# Patient Record
Sex: Male | Born: 1975 | Race: White | Hispanic: No | Marital: Married | Smoking: Never smoker
Health system: Southern US, Community
[De-identification: ages and names within clinical notes are randomized; demographics above are authoritative.]

## PROBLEM LIST (undated history)

## (undated) DIAGNOSIS — I10 Essential (primary) hypertension: Secondary | ICD-10-CM

## (undated) HISTORY — PX: APPENDECTOMY: SHX54

## (undated) HISTORY — PX: BACK SURGERY: SHX140

## (undated) HISTORY — PX: NECK SURGERY: SHX720

---

## 2013-04-27 ENCOUNTER — Other Ambulatory Visit: Payer: Self-pay | Admitting: Orthopedic Surgery

## 2013-04-27 DIAGNOSIS — M545 Low back pain: Secondary | ICD-10-CM

## 2013-04-27 DIAGNOSIS — R202 Paresthesia of skin: Secondary | ICD-10-CM

## 2013-05-04 ENCOUNTER — Other Ambulatory Visit: Payer: Self-pay

## 2013-05-29 ENCOUNTER — Ambulatory Visit
Admission: RE | Admit: 2013-05-29 | Discharge: 2013-05-29 | Disposition: A | Discharge status: Home / Self Care | Payer: Managed Care, Other (non HMO) | Source: Ambulatory Visit | Attending: Orthopedic Surgery | Admitting: Orthopedic Surgery

## 2013-05-29 DIAGNOSIS — R202 Paresthesia of skin: Secondary | ICD-10-CM

## 2013-05-29 DIAGNOSIS — M545 Low back pain, unspecified: Secondary | ICD-10-CM

## 2014-06-21 ENCOUNTER — Emergency Department (HOSPITAL_COMMUNITY): Payer: Managed Care, Other (non HMO)

## 2014-06-21 ENCOUNTER — Encounter (HOSPITAL_COMMUNITY): Payer: Self-pay | Admitting: Emergency Medicine

## 2014-06-21 ENCOUNTER — Emergency Department (HOSPITAL_COMMUNITY)
Admission: EM | Admit: 2014-06-21 | Discharge: 2014-06-21 | Disposition: A | Discharge status: Home / Self Care | Payer: Managed Care, Other (non HMO) | Attending: Emergency Medicine | Admitting: Emergency Medicine

## 2014-06-21 DIAGNOSIS — M549 Dorsalgia, unspecified: Secondary | ICD-10-CM | POA: Insufficient documentation

## 2014-06-21 DIAGNOSIS — M5489 Other dorsalgia: Secondary | ICD-10-CM

## 2014-06-21 LAB — CBC WITH DIFFERENTIAL/PLATELET
Basophils Absolute: 0 10*3/uL (ref 0.0–0.1)
Basophils Relative: 0 % (ref 0–1)
EOS ABS: 0.1 10*3/uL (ref 0.0–0.7)
EOS PCT: 2 % (ref 0–5)
HEMATOCRIT: 41.5 % (ref 39.0–52.0)
Hemoglobin: 14.2 g/dL (ref 13.0–17.0)
LYMPHS ABS: 1.4 10*3/uL (ref 0.7–4.0)
Lymphocytes Relative: 20 % (ref 12–46)
MCH: 27.2 pg (ref 26.0–34.0)
MCHC: 34.2 g/dL (ref 30.0–36.0)
MCV: 79.5 fL (ref 78.0–100.0)
MONO ABS: 0.4 10*3/uL (ref 0.1–1.0)
MONOS PCT: 7 % (ref 3–12)
Neutro Abs: 4.9 10*3/uL (ref 1.7–7.7)
Neutrophils Relative %: 71 % (ref 43–77)
Platelets: 198 10*3/uL (ref 150–400)
RBC: 5.22 MIL/uL (ref 4.22–5.81)
RDW: 14.7 % (ref 11.5–15.5)
WBC: 6.8 10*3/uL (ref 4.0–10.5)

## 2014-06-21 LAB — BASIC METABOLIC PANEL
Anion gap: 14 (ref 5–15)
BUN: 14 mg/dL (ref 6–23)
CALCIUM: 9.6 mg/dL (ref 8.4–10.5)
CO2: 24 meq/L (ref 19–32)
CREATININE: 0.92 mg/dL (ref 0.50–1.35)
Chloride: 102 mEq/L (ref 96–112)
GFR calc Af Amer: >90 mL/min (ref >90)
GFR calc non Af Amer: >90 mL/min (ref >90)
GLUCOSE: 122 mg/dL — AB (ref 70–99)
Potassium: 4.2 mEq/L (ref 3.7–5.3)
Sodium: 140 mEq/L (ref 137–147)

## 2014-06-21 LAB — D-DIMER, QUANTITATIVE: D-Dimer, Quant: 1.24 ug/mL-FEU — ABNORMAL HIGH (ref 0.00–0.48)

## 2014-06-21 LAB — TROPONIN I

## 2014-06-21 MED ORDER — HYDROCODONE-ACETAMINOPHEN 5-325 MG PO TABS: 1.0000 | ORAL_TABLET | ORAL | Status: DC | PRN

## 2014-06-21 MED ORDER — IOHEXOL 350 MG/ML SOLN
100.0000 mL | Freq: Once | INTRAVENOUS | Status: AC | PRN
  Administered 2014-06-21: 100 mL via INTRAVENOUS

## 2014-06-21 MED ORDER — CYCLOBENZAPRINE HCL 10 MG PO TABS: 10.0000 mg | ORAL_TABLET | Freq: Two times a day (BID) | ORAL | Status: DC | PRN

## 2014-06-21 NOTE — ED Notes (Signed)
PT states that he began to have left shoulder blade pain that began suddenly and woke him from sleep; pt states that the pain radiates down his left arm; the pain is not worse with movement; [facility]; pt describes it as a nagging type pain

## 2014-06-21 NOTE — ED Notes (Signed)
Pt ambulating independently w/ steady gait on d/c in no acute distress, A&Ox4. D/c instructions reviewed w/ pt and family - pt and family deny any further questions or concerns at present. Rx given x2  

## 2014-06-21 NOTE — ED Provider Notes (Signed)
Medical screening examination/treatment/procedure(s) were performed by non-physician practitioner and as supervising physician I was immediately available for consultation/collaboration.   EKG Interpretation   Date/Time:  Friday June 21 2014 02:32:55 EDT Ventricular Rate:  104 PR Interval:  142 QRS Duration: 94 QT Interval:  346 QTC Calculation: 455 R Axis:   42 Text Interpretation:  Sinus tachycardia No old tracing to compare  Confirmed by Kennie Snedden  MD, Zanyah Lentsch ([id number]) on 06/21/2014 3:10:45 AM       Olivia Mackielga M Arleny Kruger, MD 06/21/14 [phone]

## 2014-06-21 NOTE — Discharge Instructions (Signed)
Back Pain, Adult Back pain is very common. The pain often gets better over time. The cause of back pain is usually not dangerous. Most people can learn to manage their back pain on their own.  HOME CARE   Stay active. Start with short walks on flat ground if you can. Try to walk farther each day.  Do not sit, drive, or stand in one place for more than 30 minutes. Do not stay in bed.  Do not avoid exercise or work. Activity can help your back heal faster.  Be careful when you bend or lift an object. Bend at your knees, keep the object close to you, and do not twist.  Sleep on a firm mattress. Lie on your side, and bend your knees. If you lie on your back, put a pillow under your knees.  Only take medicines as told by your doctor.  Put ice on the injured area.  Put ice in a plastic bag.  Place a towel between your skin and the bag.  Leave the ice on for 15-20 minutes, 03-04 times a day for the first 2 to 3 days. After that, you can switch between ice and heat packs.  Ask your doctor about back exercises or massage.  Avoid feeling anxious or stressed. Find good ways to deal with stress, such as exercise. GET HELP RIGHT AWAY IF:   Your pain does not go away with rest or medicine.  Your pain does not go away in 1 week.  You have new problems.  You do not feel well.  The pain spreads into your legs.  You cannot control when you poop (bowel movement) or pee (urinate).  Your arms or legs feel weak or lose feeling (numbness).  You feel sick to your stomach (nauseous) or throw up (vomit).  You have belly (abdominal) pain.  You feel like you may pass out (faint). MAKE SURE YOU:   Understand these instructions.  Will watch your condition.  Will get help right away if you are not doing well or get worse. Document Released: 05/17/2008 Document Revised: 02/21/2012 Document Reviewed: 04/19/2011 ExitCare Patient Information 2015 [employer]. This information is not intended  to replace advice given to you by your health care provider. Make sure you discuss any questions you have with your health care provider.  

## 2014-06-21 NOTE — ED Provider Notes (Signed)
CSN: [id number]     Arrival date & time 06/21/14  0223 History   First MD Initiated Contact with Patient 06/21/14 0324     Chief Complaint  Patient presents with  . Back Pain     (Consider location/radiation/quality/duration/timing/severity/associated sxs/prior Treatment) Patient is a 38 y.o. male presenting with back pain. The history is provided by the patient. No language interpreter was used.  Back Pain Location:  Thoracic spine Associated symptoms: no chest pain and no fever   Associated symptoms comment:  Sudden onset of pain in the posterior left shoulder and upper back that woke him from sleep. He denies SOB, chest pain. The discomfort is no worse with deep breathing or movement. He denies cough or fever. No nausea.    History reviewed. No pertinent past medical history. Past Surgical History  Procedure Laterality Date  . Neck surgery    . Back surgery     No family history on file. History  Substance Use Topics  . Smoking status: Never Smoker   . Smokeless tobacco: Not on file  . Alcohol Use: Yes     Comment: casual    Review of Systems  Constitutional: Negative for fever and chills.  HENT: Negative.   Respiratory: Negative.  Negative for cough and shortness of breath.   Cardiovascular: Negative.  Negative for chest pain.  Gastrointestinal: Negative.  Negative for nausea and vomiting.  Musculoskeletal: Positive for back pain.  Skin: Negative.   Neurological: Negative.       Allergies  Review of patient's allergies indicates no known allergies.  Home Medications   Prior to Admission medications   Medication Sig Start Date End Date Taking? Authorizing Provider  ibuprofen (ADVIL,MOTRIN) 200 MG tablet Take 400 mg by mouth every 6 (six) hours as needed for moderate pain.   Yes Historical Provider, MD  Multiple Vitamin (MULTIVITAMIN WITH MINERALS) TABS tablet Take 1 tablet by mouth daily.   Yes Historical Provider, MD   BP 165/105  Pulse 95  Temp(Src) 98.6  F (37 C) (Oral)  Resp 23  Ht 6' (1.829 m)  Wt 255 lb (115.667 kg)  BMI 34.58 kg/m2  SpO2 100% Physical Exam  Constitutional: He is oriented to person, place, and time. He appears well-developed and well-nourished.  HENT:  Head: Normocephalic.  Neck: Normal range of motion. Neck supple.  Cardiovascular: Normal rate and regular rhythm.   Pulmonary/Chest: Effort normal and breath sounds normal.  Abdominal: Soft. Bowel sounds are normal. There is no tenderness. There is no rebound and no guarding.  Musculoskeletal: Normal range of motion.  No reproducible tenderness of left upper back at site of complaint.  Neurological: He is alert and oriented to person, place, and time.  Skin: Skin is warm and dry. No rash noted.  Psychiatric: He has a normal mood and affect.    ED Course  Procedures (including critical care time) Labs Review Labs Reviewed  D-DIMER, QUANTITATIVE - Abnormal; Notable for the following:    D-Dimer, Quant 1.24 (*)    All other components within normal limits  CBC WITH DIFFERENTIAL  TROPONIN I  BASIC METABOLIC PANEL    Imaging Review Dg Chest 2 View  06/21/2014   CLINICAL DATA:  Upper left back pain near the scapula.  EXAM: CHEST  2 VIEW  COMPARISON:  None.  FINDINGS: Normal heart size and pulmonary vascularity. No focal airspace disease or consolidation in the lungs. No blunting of costophrenic angles. No pneumothorax. Postoperative changes in the cervical spine.  IMPRESSION:  No evidence of active pulmonary disease.   Electronically Signed   By: Burman NievesWilliam  Stevens M.D.   On: 06/21/2014 04:42     EKG Interpretation   Date/Time:  Friday June 21 2014 02:32:55 EDT Ventricular Rate:  104 PR Interval:  142 QRS Duration: 94 QT Interval:  346 QTC Calculation: 455 R Axis:   42 Text Interpretation:  Sinus tachycardia No old tracing to compare  Confirmed by OTTER  MD, OLGA ([id number]) on 06/21/2014 3:10:45 AM      MDM   Final diagnoses:  None    1. Back  pain  Elevated D-dimer in the setting of recent travel.  DDx:   angina - normal EKG, constant pain without SOB, nausea or CP. Troponin pending  PE - sole known risk factor is travel which he does frequently. No SOB, CP or pleuritic component, however, d-dimer positive. CT angio chest pending  Muscular back pain - not reproducible and no worse with movement.   Patient care transferred to Sharilyn SitesLisa Sanders pending labs and CT chest.      Arnoldo HookerShari A Milaya Hora, PA-C 06/21/14 [phone]

## 2014-06-21 NOTE — ED Notes (Signed)
Reports he awoke w/ left upper back pain "near his scapula" - pain awoke pt from his sleep. Pt denies shortness of breath, fever or chills.

## 2015-05-23 ENCOUNTER — Other Ambulatory Visit: Payer: Self-pay | Admitting: Orthopedic Surgery

## 2015-05-30 NOTE — Pre-Procedure Instructions (Signed)
    Gavin Khan  05/30/2015      [id number] [place], [place] - [address] [facility] [address] Phone: [phone] Fax: [phone]    Your procedure is scheduled on 06/04/15.  Report to [facility] Admitting at 630 A.M.  Call this number if you have problems the morning of surgery:  [phone]   Remember:  Do not eat food or drink liquids after midnight.  Take these medicines the morning of surgery with A SIP OF WATER flexeril,hydrocodone   Do not wear jewelry, make-up or nail polish.  Do not wear lotions, powders, or perfumes.  You may wear deodorant.  Do not shave 48 hours prior to surgery.  Men may shave face and neck.  Do not bring valuables to the hospital.  [facility] is not responsible for any belongings or valuables.  Contacts, dentures or bridgework may not be worn into surgery.  Leave your suitcase in the car.  After surgery it may be brought to your room.  For patients admitted to the hospital, discharge time will be determined by your treatment team.  Patients discharged the day of surgery will not be allowed to drive home.   Name and phone numbr of your driver:    Special instructions:   Please read over the following fact sheets that you were given. Pain Booklet, Coughing and Deep Breathing, Total Joint Packet and MRSA Information

## 2015-06-02 ENCOUNTER — Encounter (HOSPITAL_COMMUNITY): Payer: Self-pay

## 2015-06-02 ENCOUNTER — Encounter (HOSPITAL_COMMUNITY)
Admission: RE | Admit: 2015-06-02 | Discharge: 2015-06-02 | Disposition: A | Discharge status: Home / Self Care | Payer: Managed Care, Other (non HMO) | Source: Ambulatory Visit | Attending: Orthopedic Surgery | Admitting: Orthopedic Surgery

## 2015-06-02 DIAGNOSIS — M79602 Pain in left arm: Secondary | ICD-10-CM | POA: Diagnosis not present

## 2015-06-02 DIAGNOSIS — R200 Anesthesia of skin: Secondary | ICD-10-CM | POA: Diagnosis not present

## 2015-06-02 DIAGNOSIS — I10 Essential (primary) hypertension: Secondary | ICD-10-CM | POA: Diagnosis not present

## 2015-06-02 DIAGNOSIS — M79642 Pain in left hand: Secondary | ICD-10-CM | POA: Diagnosis not present

## 2015-06-02 DIAGNOSIS — M4803 Spinal stenosis, cervicothoracic region: Secondary | ICD-10-CM | POA: Diagnosis not present

## 2015-06-02 LAB — COMPREHENSIVE METABOLIC PANEL
ALT: 27 U/L (ref 17–63)
AST: 28 U/L (ref 15–41)
Albumin: 3.8 g/dL (ref 3.5–5.0)
Alkaline Phosphatase: 74 U/L (ref 38–126)
Anion gap: 6 (ref 5–15)
BUN: 15 mg/dL (ref 6–20)
CO2: 29 mmol/L (ref 22–32)
CREATININE: 0.95 mg/dL (ref 0.61–1.24)
Calcium: 9.8 mg/dL (ref 8.9–10.3)
Chloride: 105 mmol/L (ref 101–111)
GFR calc Af Amer: >60 mL/min (ref >60)
GFR calc non Af Amer: >60 mL/min (ref >60)
Glucose, Bld: 113 mg/dL — ABNORMAL HIGH (ref 65–99)
POTASSIUM: 3.9 mmol/L (ref 3.5–5.1)
Sodium: 140 mmol/L (ref 135–145)
Total Bilirubin: 0.6 mg/dL (ref 0.3–1.2)
Total Protein: 7.9 g/dL (ref 6.5–8.1)

## 2015-06-02 LAB — URINALYSIS, ROUTINE W REFLEX MICROSCOPIC
Bilirubin Urine: NEGATIVE
GLUCOSE, UA: NEGATIVE mg/dL
Hgb urine dipstick: NEGATIVE
KETONES UR: NEGATIVE mg/dL
LEUKOCYTES UA: NEGATIVE
Nitrite: NEGATIVE
PH: 5 (ref 5.0–8.0)
PROTEIN: NEGATIVE mg/dL
Specific Gravity, Urine: 1.015 (ref 1.005–1.030)
Urobilinogen, UA: 0.2 mg/dL (ref 0.0–1.0)

## 2015-06-02 LAB — CBC WITH DIFFERENTIAL/PLATELET
Basophils Absolute: 0 10*3/uL (ref 0.0–0.1)
Basophils Relative: 0 % (ref 0–1)
Eosinophils Absolute: 0.3 10*3/uL (ref 0.0–0.7)
Eosinophils Relative: 5 % (ref 0–5)
HCT: 40.3 % (ref 39.0–52.0)
Hemoglobin: 14.1 g/dL (ref 13.0–17.0)
LYMPHS PCT: 25 % (ref 12–46)
Lymphs Abs: 1.8 10*3/uL (ref 0.7–4.0)
MCH: 28.1 pg (ref 26.0–34.0)
MCHC: 35 g/dL (ref 30.0–36.0)
MCV: 80.4 fL (ref 78.0–100.0)
MONOS PCT: 7 % (ref 3–12)
Monocytes Absolute: 0.5 10*3/uL (ref 0.1–1.0)
NEUTROS ABS: 4.4 10*3/uL (ref 1.7–7.7)
NEUTROS PCT: 63 % (ref 43–77)
Platelets: 221 10*3/uL (ref 150–400)
RBC: 5.01 MIL/uL (ref 4.22–5.81)
RDW: 14.4 % (ref 11.5–15.5)
WBC: 7 10*3/uL (ref 4.0–10.5)

## 2015-06-02 LAB — APTT: aPTT: 29 seconds (ref 24–37)

## 2015-06-02 LAB — SURGICAL PCR SCREEN
MRSA, PCR: NEGATIVE
STAPHYLOCOCCUS AUREUS: NEGATIVE

## 2015-06-02 LAB — PROTIME-INR
INR: 1.01 (ref 0.00–1.49)
Prothrombin Time: 13.5 seconds (ref 11.6–15.2)

## 2015-06-03 MED ORDER — CEFAZOLIN SODIUM-DEXTROSE 2-3 GM-% IV SOLR
2.0000 g | INTRAVENOUS | Status: AC
  Administered 2015-06-04 (×2): 2 g via INTRAVENOUS
  Filled 2015-06-03: qty 50

## 2015-06-04 ENCOUNTER — Ambulatory Visit (HOSPITAL_COMMUNITY): Payer: Managed Care, Other (non HMO)

## 2015-06-04 ENCOUNTER — Encounter (HOSPITAL_COMMUNITY): Payer: Self-pay | Admitting: *Deleted

## 2015-06-04 ENCOUNTER — Observation Stay (HOSPITAL_COMMUNITY)
Admission: RE | Admit: 2015-06-04 | Discharge: 2015-06-05 | Disposition: A | Discharge status: Home / Self Care | Payer: Managed Care, Other (non HMO) | Source: Ambulatory Visit | Attending: Orthopedic Surgery | Admitting: Orthopedic Surgery

## 2015-06-04 ENCOUNTER — Ambulatory Visit (HOSPITAL_COMMUNITY): Payer: Managed Care, Other (non HMO) | Admitting: Anesthesiology

## 2015-06-04 ENCOUNTER — Encounter (HOSPITAL_COMMUNITY)
Admission: RE | Disposition: A | Discharge status: Home / Self Care | Payer: Managed Care, Other (non HMO) | Source: Ambulatory Visit | Attending: Orthopedic Surgery

## 2015-06-04 ENCOUNTER — Other Ambulatory Visit: Payer: Self-pay

## 2015-06-04 DIAGNOSIS — M79602 Pain in left arm: Secondary | ICD-10-CM | POA: Insufficient documentation

## 2015-06-04 DIAGNOSIS — M4803 Spinal stenosis, cervicothoracic region: Principal | ICD-10-CM | POA: Insufficient documentation

## 2015-06-04 DIAGNOSIS — M79642 Pain in left hand: Secondary | ICD-10-CM | POA: Insufficient documentation

## 2015-06-04 DIAGNOSIS — R200 Anesthesia of skin: Secondary | ICD-10-CM | POA: Insufficient documentation

## 2015-06-04 DIAGNOSIS — G549 Nerve root and plexus disorder, unspecified: Secondary | ICD-10-CM | POA: Diagnosis present

## 2015-06-04 DIAGNOSIS — I10 Essential (primary) hypertension: Secondary | ICD-10-CM | POA: Insufficient documentation

## 2015-06-04 DIAGNOSIS — Z419 Encounter for procedure for purposes other than remedying health state, unspecified: Secondary | ICD-10-CM

## 2015-06-04 HISTORY — PX: ANTERIOR CERVICAL DECOMP/DISCECTOMY FUSION: SHX1161

## 2015-06-04 SURGERY — ANTERIOR CERVICAL DECOMPRESSION/DISCECTOMY FUSION 3 LEVELS
Anesthesia: General | Site: Neck

## 2015-06-04 MED ORDER — FENTANYL CITRATE (PF) 250 MCG/5ML IJ SOLN
INTRAMUSCULAR | Status: AC
  Filled 2015-06-04: qty 5

## 2015-06-04 MED ORDER — SODIUM CHLORIDE 0.9 % IJ SOLN: 3.0000 mL | Freq: Two times a day (BID) | INTRAMUSCULAR | Status: DC

## 2015-06-04 MED ORDER — HYDROMORPHONE HCL 1 MG/ML IJ SOLN
INTRAMUSCULAR | Status: AC
  Filled 2015-06-04: qty 1

## 2015-06-04 MED ORDER — MIDAZOLAM HCL 2 MG/2ML IJ SOLN
INTRAMUSCULAR | Status: AC
  Filled 2015-06-04: qty 2

## 2015-06-04 MED ORDER — BISACODYL 5 MG PO TBEC
5.0000 mg | DELAYED_RELEASE_TABLET | Freq: Every day | ORAL | Status: DC | PRN
  Filled 2015-06-04: qty 1

## 2015-06-04 MED ORDER — OXYCODONE-ACETAMINOPHEN 5-325 MG PO TABS
ORAL_TABLET | ORAL | Status: AC
  Filled 2015-06-04: qty 2

## 2015-06-04 MED ORDER — GLYCOPYRROLATE 0.2 MG/ML IJ SOLN
INTRAMUSCULAR | Status: DC | PRN
  Administered 2015-06-04: 0.4 mg via INTRAVENOUS

## 2015-06-04 MED ORDER — HYDROCODONE-ACETAMINOPHEN 5-325 MG PO TABS
1.0000 | ORAL_TABLET | ORAL | Status: DC | PRN
Start: 2015-06-04 — End: 2015-06-05

## 2015-06-04 MED ORDER — PHENYLEPHRINE HCL 10 MG/ML IJ SOLN
INTRAMUSCULAR | Status: DC | PRN
  Administered 2015-06-04 (×2): 80 ug via INTRAVENOUS
  Administered 2015-06-04: 40 ug via INTRAVENOUS
  Administered 2015-06-04 (×3): 80 ug via INTRAVENOUS
  Administered 2015-06-04: 40 ug via INTRAVENOUS

## 2015-06-04 MED ORDER — ZOLPIDEM TARTRATE 5 MG PO TABS: 5.0000 mg | ORAL_TABLET | Freq: Every evening | ORAL | Status: DC | PRN

## 2015-06-04 MED ORDER — LISINOPRIL-HYDROCHLOROTHIAZIDE 10-12.5 MG PO TABS: 1.0000 | ORAL_TABLET | Freq: Every day | ORAL | Status: DC

## 2015-06-04 MED ORDER — POVIDONE-IODINE 7.5 % EX SOLN
Freq: Once | CUTANEOUS | Status: DC
  Filled 2015-06-04: qty 118

## 2015-06-04 MED ORDER — LIDOCAINE HCL (CARDIAC) 20 MG/ML IV SOLN
INTRAVENOUS | Status: DC | PRN
  Administered 2015-06-04: 40 mg via INTRAVENOUS

## 2015-06-04 MED ORDER — DIAZEPAM 5 MG PO TABS
ORAL_TABLET | ORAL | Status: AC
  Filled 2015-06-04: qty 1

## 2015-06-04 MED ORDER — ONDANSETRON HCL 4 MG/2ML IJ SOLN
INTRAMUSCULAR | Status: AC
  Filled 2015-06-04: qty 2

## 2015-06-04 MED ORDER — MORPHINE SULFATE 2 MG/ML IJ SOLN
1.0000 mg | INTRAMUSCULAR | Status: DC | PRN
Start: 2015-06-04 — End: 2015-06-05
  Administered 2015-06-04: 4 mg via INTRAVENOUS
  Filled 2015-06-04: qty 2

## 2015-06-04 MED ORDER — PROPOFOL 10 MG/ML IV BOLUS
INTRAVENOUS | Status: DC | PRN
  Administered 2015-06-04: 100 mg via INTRAVENOUS
  Administered 2015-06-04: 200 mg via INTRAVENOUS

## 2015-06-04 MED ORDER — HYDROCHLOROTHIAZIDE 12.5 MG PO CAPS
12.5000 mg | ORAL_CAPSULE | Freq: Every day | ORAL | Status: DC
  Administered 2015-06-04: 12.5 mg via ORAL
  Filled 2015-06-04 (×2): qty 1

## 2015-06-04 MED ORDER — THROMBIN 20000 UNITS EX KIT
PACK | CUTANEOUS | Status: DC | PRN
  Administered 2015-06-04: 20000 [IU] via TOPICAL

## 2015-06-04 MED ORDER — THROMBIN 20000 UNITS EX SOLR
CUTANEOUS | Status: AC
  Filled 2015-06-04: qty 20000

## 2015-06-04 MED ORDER — CEFAZOLIN SODIUM 1-5 GM-% IV SOLN
1.0000 g | Freq: Three times a day (TID) | INTRAVENOUS | Status: AC
  Administered 2015-06-04 – 2015-06-05 (×2): 1 g via INTRAVENOUS
  Filled 2015-06-04 (×2): qty 50

## 2015-06-04 MED ORDER — BUPIVACAINE-EPINEPHRINE (PF) 0.25% -1:200000 IJ SOLN
INTRAMUSCULAR | Status: AC
  Filled 2015-06-04: qty 30

## 2015-06-04 MED ORDER — NEOSTIGMINE METHYLSULFATE 10 MG/10ML IV SOLN
INTRAVENOUS | Status: DC | PRN
  Administered 2015-06-04: 3 mg via INTRAVENOUS

## 2015-06-04 MED ORDER — VITAMIN D2 50 MCG (2000 UT) PO TABS
2000.0000 [IU] | ORAL_TABLET | Freq: Every day | ORAL | Status: DC
  Administered 2015-06-04: 2000 [IU] via ORAL
  Filled 2015-06-04 (×2): qty 1

## 2015-06-04 MED ORDER — ONDANSETRON HCL 4 MG/2ML IJ SOLN: 4.0000 mg | INTRAMUSCULAR | Status: DC | PRN

## 2015-06-04 MED ORDER — ROCURONIUM BROMIDE 50 MG/5ML IV SOLN
INTRAVENOUS | Status: AC
  Filled 2015-06-04: qty 1

## 2015-06-04 MED ORDER — ACETAMINOPHEN 325 MG PO TABS: 650.0000 mg | ORAL_TABLET | ORAL | Status: DC | PRN

## 2015-06-04 MED ORDER — FENTANYL CITRATE (PF) 100 MCG/2ML IJ SOLN
INTRAMUSCULAR | Status: DC | PRN
  Administered 2015-06-04: 100 ug via INTRAVENOUS
  Administered 2015-06-04 (×2): 50 ug via INTRAVENOUS
  Administered 2015-06-04: 150 ug via INTRAVENOUS
  Administered 2015-06-04: 50 ug via INTRAVENOUS

## 2015-06-04 MED ORDER — LACTATED RINGERS IV SOLN
INTRAVENOUS | Status: DC | PRN
  Administered 2015-06-04 (×3): via INTRAVENOUS

## 2015-06-04 MED ORDER — DIAZEPAM 5 MG PO TABS
5.0000 mg | ORAL_TABLET | Freq: Four times a day (QID) | ORAL | Status: DC | PRN
  Administered 2015-06-04 (×2): 5 mg via ORAL
  Filled 2015-06-04: qty 1

## 2015-06-04 MED ORDER — BUPIVACAINE-EPINEPHRINE 0.25% -1:200000 IJ SOLN
INTRAMUSCULAR | Status: DC | PRN
  Administered 2015-06-04: 7 mL

## 2015-06-04 MED ORDER — OXYCODONE-ACETAMINOPHEN 5-325 MG PO TABS
1.0000 | ORAL_TABLET | ORAL | Status: DC | PRN
  Administered 2015-06-04 – 2015-06-05 (×4): 2 via ORAL
  Filled 2015-06-04 (×3): qty 2

## 2015-06-04 MED ORDER — MEPERIDINE HCL 25 MG/ML IJ SOLN: 6.2500 mg | INTRAMUSCULAR | Status: DC | PRN

## 2015-06-04 MED ORDER — DOCUSATE SODIUM 100 MG PO CAPS
100.0000 mg | ORAL_CAPSULE | Freq: Two times a day (BID) | ORAL | Status: DC
  Administered 2015-06-04: 100 mg via ORAL
  Filled 2015-06-04: qty 1

## 2015-06-04 MED ORDER — PROMETHAZINE HCL 25 MG/ML IJ SOLN: 6.2500 mg | INTRAMUSCULAR | Status: DC | PRN

## 2015-06-04 MED ORDER — LISINOPRIL 10 MG PO TABS
10.0000 mg | ORAL_TABLET | Freq: Every day | ORAL | Status: DC
  Administered 2015-06-04: 10 mg via ORAL
  Filled 2015-06-04 (×2): qty 1

## 2015-06-04 MED ORDER — ALUM & MAG HYDROXIDE-SIMETH 200-200-20 MG/5ML PO SUSP: 30.0000 mL | Freq: Four times a day (QID) | ORAL | Status: DC | PRN

## 2015-06-04 MED ORDER — SODIUM CHLORIDE 0.9 % IJ SOLN: 3.0000 mL | INTRAMUSCULAR | Status: DC | PRN

## 2015-06-04 MED ORDER — FLEET ENEMA 7-19 GM/118ML RE ENEM
1.0000 | ENEMA | Freq: Once | RECTAL | Status: AC | PRN
  Filled 2015-06-04: qty 1

## 2015-06-04 MED ORDER — HYDROMORPHONE HCL 1 MG/ML IJ SOLN
0.2500 mg | INTRAMUSCULAR | Status: DC | PRN
  Administered 2015-06-04 (×3): 0.5 mg via INTRAVENOUS

## 2015-06-04 MED ORDER — MIDAZOLAM HCL 5 MG/5ML IJ SOLN
INTRAMUSCULAR | Status: DC | PRN
  Administered 2015-06-04: 2 mg via INTRAVENOUS

## 2015-06-04 MED ORDER — MIDAZOLAM HCL 2 MG/2ML IJ SOLN: 0.5000 mg | Freq: Once | INTRAMUSCULAR | Status: DC | PRN

## 2015-06-04 MED ORDER — MENTHOL 3 MG MT LOZG: 1.0000 | LOZENGE | OROMUCOSAL | Status: DC | PRN

## 2015-06-04 MED ORDER — ACETAMINOPHEN 650 MG RE SUPP
650.0000 mg | RECTAL | Status: DC | PRN
Start: 2015-06-04 — End: 2015-06-05

## 2015-06-04 MED ORDER — PHENOL 1.4 % MT LIQD
1.0000 | OROMUCOSAL | Status: DC | PRN
  Administered 2015-06-04: 1 via OROMUCOSAL
  Filled 2015-06-04: qty 177

## 2015-06-04 MED ORDER — ROCURONIUM BROMIDE 100 MG/10ML IV SOLN
INTRAVENOUS | Status: DC | PRN
  Administered 2015-06-04: 30 mg via INTRAVENOUS
  Administered 2015-06-04: 20 mg via INTRAVENOUS
  Administered 2015-06-04: 50 mg via INTRAVENOUS
  Administered 2015-06-04: 10 mg via INTRAVENOUS
  Administered 2015-06-04: 20 mg via INTRAVENOUS

## 2015-06-04 MED ORDER — PROPOFOL 10 MG/ML IV BOLUS
INTRAVENOUS | Status: AC
  Filled 2015-06-04: qty 20

## 2015-06-04 MED ORDER — ONDANSETRON HCL 4 MG/2ML IJ SOLN
INTRAMUSCULAR | Status: DC | PRN
  Administered 2015-06-04 (×2): 4 mg via INTRAVENOUS

## 2015-06-04 MED ORDER — CEFAZOLIN SODIUM-DEXTROSE 2-3 GM-% IV SOLR
INTRAVENOUS | Status: AC
  Filled 2015-06-04: qty 50

## 2015-06-04 MED ORDER — SENNOSIDES-DOCUSATE SODIUM 8.6-50 MG PO TABS
1.0000 | ORAL_TABLET | Freq: Every evening | ORAL | Status: DC | PRN
  Filled 2015-06-04: qty 1

## 2015-06-04 MED ORDER — LIDOCAINE HCL (CARDIAC) 20 MG/ML IV SOLN
INTRAVENOUS | Status: AC
  Filled 2015-06-04: qty 5

## 2015-06-04 MED ORDER — DEXAMETHASONE SODIUM PHOSPHATE 4 MG/ML IJ SOLN
INTRAMUSCULAR | Status: AC
  Filled 2015-06-04: qty 2

## 2015-06-04 SURGICAL SUPPLY — 80 items
BENZOIN TINCTURE PRP APPL 2/3 (GAUZE/BANDAGES/DRESSINGS) ×3 IMPLANT
BIT DRILL NEURO 2X3.1 SFT TUCH (MISCELLANEOUS) ×1 IMPLANT
BIT DRILL SRG 14X2.2XFLT CHK (BIT) ×1 IMPLANT
BIT DRL SRG 14X2.2XFLT CHK (BIT) ×1
BLADE SURG 15 STRL LF DISP TIS (BLADE) ×1 IMPLANT
BLADE SURG 15 STRL SS (BLADE) ×2
BLADE SURG ROTATE 9660 (MISCELLANEOUS) ×3 IMPLANT
BUR MATCHSTICK NEURO 3.0 LAGG (BURR) IMPLANT
CARTRIDGE OIL MAESTRO DRILL (MISCELLANEOUS) ×1 IMPLANT
CLOSURE STERI-STRIP 1/2X4 (GAUZE/BANDAGES/DRESSINGS) ×1
CLOSURE WOUND 1/2 X4 (GAUZE/BANDAGES/DRESSINGS) ×1
CLSR STERI-STRIP ANTIMIC 1/2X4 (GAUZE/BANDAGES/DRESSINGS) ×2 IMPLANT
CORDS BIPOLAR (ELECTRODE) ×3 IMPLANT
COVER SURGICAL LIGHT HANDLE (MISCELLANEOUS) ×3 IMPLANT
CRADLE DONUT ADULT HEAD (MISCELLANEOUS) ×3 IMPLANT
DIFFUSER DRILL AIR PNEUMATIC (MISCELLANEOUS) ×3 IMPLANT
DRAIN JACKSON RD 7FR 3/32 (WOUND CARE) IMPLANT
DRAPE C-ARM 42X72 X-RAY (DRAPES) ×3 IMPLANT
DRAPE POUCH INSTRU U-SHP 10X18 (DRAPES) ×3 IMPLANT
DRAPE SURG 17X23 STRL (DRAPES) ×9 IMPLANT
DRILL BIT SKYLINE 14MM (BIT) ×2
DRILL NEURO 2X3.1 SOFT TOUCH (MISCELLANEOUS) ×3
DURAPREP 26ML APPLICATOR (WOUND CARE) ×3 IMPLANT
ELECT COATED BLADE 2.86 ST (ELECTRODE) ×3 IMPLANT
ELECT REM PT RETURN 9FT ADLT (ELECTROSURGICAL) ×3
ELECTRODE REM PT RTRN 9FT ADLT (ELECTROSURGICAL) ×1 IMPLANT
EVACUATOR SILICONE 100CC (DRAIN) IMPLANT
GAUZE SPONGE 4X4 12PLY STRL (GAUZE/BANDAGES/DRESSINGS) ×3 IMPLANT
GAUZE SPONGE 4X4 16PLY XRAY LF (GAUZE/BANDAGES/DRESSINGS) ×3 IMPLANT
GLOVE BIO SURGEON STRL SZ7 (GLOVE) ×3 IMPLANT
GLOVE BIO SURGEON STRL SZ8 (GLOVE) ×3 IMPLANT
GLOVE BIOGEL PI IND STRL 7.0 (GLOVE) ×2 IMPLANT
GLOVE BIOGEL PI IND STRL 8 (GLOVE) ×1 IMPLANT
GLOVE BIOGEL PI INDICATOR 7.0 (GLOVE) ×4
GLOVE BIOGEL PI INDICATOR 8 (GLOVE) ×2
GOWN STRL REUS W/ TWL LRG LVL3 (GOWN DISPOSABLE) ×1 IMPLANT
GOWN STRL REUS W/ TWL XL LVL3 (GOWN DISPOSABLE) ×1 IMPLANT
GOWN STRL REUS W/TWL LRG LVL3 (GOWN DISPOSABLE) ×2
GOWN STRL REUS W/TWL XL LVL3 (GOWN DISPOSABLE) ×2
IMPL S ENDOSKEL TC 8MM ODEG (Orthopedic Implant) ×1 IMPLANT
IMPLANT S ENDOSKEL TC 8MM ODEG (Orthopedic Implant) ×3 IMPLANT
INTERLOCK LRDTC CRVCL VBR 7MM (Bone Implant) ×2 IMPLANT
IV CATH 14GX2 1/4 (CATHETERS) ×3 IMPLANT
KIT BASIN OR (CUSTOM PROCEDURE TRAY) ×3 IMPLANT
KIT ROOM TURNOVER OR (KITS) ×3 IMPLANT
LORDOTIC CERVICAL VBR 7MM SM (Bone Implant) ×6 IMPLANT
MANIFOLD NEPTUNE II (INSTRUMENTS) ×3 IMPLANT
NEEDLE 27GAX1X1/2 (NEEDLE) ×3 IMPLANT
NEEDLE SPNL 20GX3.5 QUINCKE YW (NEEDLE) ×3 IMPLANT
NS IRRIG 1000ML POUR BTL (IV SOLUTION) ×3 IMPLANT
OIL CARTRIDGE MAESTRO DRILL (MISCELLANEOUS) ×3
PACK ORTHO CERVICAL (CUSTOM PROCEDURE TRAY) ×3 IMPLANT
PAD ARMBOARD 7.5X6 YLW CONV (MISCELLANEOUS) ×6 IMPLANT
PATTIES SURGICAL .5 X.5 (GAUZE/BANDAGES/DRESSINGS) IMPLANT
PATTIES SURGICAL .5 X1 (DISPOSABLE) IMPLANT
PIN DISTRACTION 14 (PIN) ×6 IMPLANT
PLATE SKYLINE 4LVL 76MM (Plate) ×3 IMPLANT
PLATE SKYLINE 80MM (Plate) ×3 IMPLANT
PUTTY BONE DBX 5CC MIX (Putty) ×3 IMPLANT
SCREW SKYLINE VAR OS 14MM (Screw) ×9 IMPLANT
SCREW VAR SELF TAP SKYLINE 14M (Screw) ×21 IMPLANT
SPONGE GAUZE 4X4 12PLY STER LF (GAUZE/BANDAGES/DRESSINGS) ×3 IMPLANT
SPONGE INTESTINAL PEANUT (DISPOSABLE) ×3 IMPLANT
SPONGE SURGIFOAM ABS GEL 100 (HEMOSTASIS) IMPLANT
STRIP CLOSURE SKIN 1/2X4 (GAUZE/BANDAGES/DRESSINGS) ×2 IMPLANT
SURGIFLO TRUKIT (HEMOSTASIS) IMPLANT
SUT MNCRL AB 4-0 PS2 18 (SUTURE) IMPLANT
SUT SILK 4 0 (SUTURE)
SUT SILK 4-0 18XBRD TIE 12 (SUTURE) IMPLANT
SUT VIC AB 2-0 CT2 18 VCP726D (SUTURE) ×3 IMPLANT
SYR BULB IRRIGATION 50ML (SYRINGE) ×3 IMPLANT
SYR CONTROL 10ML LL (SYRINGE) ×3 IMPLANT
TAPE CLOTH 4X10 WHT NS (GAUZE/BANDAGES/DRESSINGS) ×3 IMPLANT
TAPE CLOTH SURG 4X10 WHT LF (GAUZE/BANDAGES/DRESSINGS) ×3 IMPLANT
TAPE UMBILICAL COTTON 1/8X30 (MISCELLANEOUS) ×6 IMPLANT
TOWEL OR 17X24 6PK STRL BLUE (TOWEL DISPOSABLE) ×3 IMPLANT
TOWEL OR 17X26 10 PK STRL BLUE (TOWEL DISPOSABLE) ×3 IMPLANT
TRAY FOLEY CATH 16FRSI W/METER (SET/KITS/TRAYS/PACK) ×3 IMPLANT
WATER STERILE IRR 1000ML POUR (IV SOLUTION) ×3 IMPLANT
YANKAUER SUCT BULB TIP NO VENT (SUCTIONS) ×3 IMPLANT

## 2015-06-04 NOTE — H&P (Signed)
     PREOPERATIVE H&P  Chief Complaint: progressive left hand weakness  HPI: Gavin Khan is a 39 y.o. male who presents with ongoing pain in the left hand and arm. 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 Medical history non-contributory    Past Surgical History  Procedure Laterality Date  . Neck surgery    . Back surgery    . Appendectomy     History   Social History  . Marital Status: Married    Spouse Name: N/A  . Number of Children: N/A  . Years of Education: N/A   Social History Main Topics  . Smoking status: Never Smoker   . Smokeless tobacco: Not on file  . Alcohol Use: Yes     Comment: casual  . Drug Use: No  . Sexual Activity: Not on file   Other Topics Concern  . None   Social History Narrative   History reviewed. No pertinent family history. No Known Allergies Prior to Admission medications   Medication Sig Start Date End Date Taking? Authorizing Provider  cyclobenzaprine (FLEXERIL) 10 MG tablet Take 1 tablet (10 mg total) by mouth 2 (two) times daily as needed for muscle spasms. Patient taking differently: Take 10 mg by mouth at bedtime.  06/21/14  Yes Elpidio Anis, PA-C  Ergocalciferol (VITAMIN D2) 2000 UNITS TABS Take 2,000 Units by mouth daily.   Yes Historical Provider, MD  ibuprofen (ADVIL,MOTRIN) 200 MG tablet Take 400 mg by mouth every 6 (six) hours as needed (pain).    Yes Historical Provider, MD  lisinopril-hydrochlorothiazide (PRINZIDE,ZESTORETIC) 10-12.5 MG per tablet Take 1 tablet by mouth daily.   Yes Historical Provider, MD  HYDROcodone-acetaminophen (NORCO/VICODIN) 5-325 MG per tablet Take 1-2 tablets by mouth every 4 (four) hours as  needed. Patient not taking: Reported on 05/30/2015 06/21/14   Elpidio Anis, PA-C     All other systems have been reviewed and were otherwise negative with the exception of those mentioned in the HPI and as above.  Physical Exam: Filed Vitals:   06/04/15 0703  BP: 166/75  Pulse: 103  Temp: 98.5 F (36.9 C)  Resp: 20    General: Alert, no acute distress Cardiovascular: No pedal edema Respiratory: No cyanosis, no use of accessory musculature Skin: No lesions in the area of chief complaint Neurologic: Sensation intact distally Psychiatric: Patient is competent for consent with normal mood and affect Lymphatic: No axillary or cervical lymphadenopathy  MUSCULOSKELETAL: prominent weakness on left: 2/5 finger abduction 4-/5 wrist extension 4/5 triceps  Assessment/Plan: Progressive left arm weakness and numbness Plan for Procedure(s): ANTERIOR CERVICAL DECOMPRESSION/DISCECTOMY FUSION 3 LEVELS REMOVAL OF INSTRUMENTATION   Emilee Hero, MD 06/04/2015 7:57 AM

## 2015-06-04 NOTE — Plan of Care (Signed)
Problem: Consults Goal: Diagnosis - Spinal Surgery Outcome: Completed/Met Date Met:  06/04/15 Cervical Spine Fusion     

## 2015-06-04 NOTE — Anesthesia Preprocedure Evaluation (Addendum)
Anesthesia Evaluation  Patient identified by MRN, date of birth, ID band Patient awake    Reviewed: Allergy & Precautions, NPO status , Patient's Chart, lab work & pertinent test results  History of Anesthesia Complications Negative for: history of anesthetic complications  Airway Mallampati: I  TM Distance: >3 FB Neck ROM: Full    Dental  (+) Dental Advisory Given   Pulmonary neg pulmonary ROS,  breath sounds clear to auscultation        Cardiovascular hypertension, Pt. on medications + angina Rhythm:Regular Rate:Normal     Neuro/Psych negative neurological ROS     GI/Hepatic Neg liver ROS,   Endo/Other  Morbid obesity  Renal/GU negative Renal ROS     Musculoskeletal   Abdominal (+) + obese,   Peds  Hematology negative hematology ROS (+)   Anesthesia Other Findings   Reproductive/Obstetrics                            Anesthesia Physical Anesthesia Plan  ASA: II  Anesthesia Plan: General   Post-op Pain Management:    Induction: Intravenous  Airway Management Planned: Oral ETT  Additional Equipment:   Intra-op Plan:   Post-operative Plan: Extubation in OR  Informed Consent: I have reviewed the patients History and Physical, chart, labs and discussed the procedure including the risks, benefits and alternatives for the proposed anesthesia with the patient or authorized representative who has indicated his/her understanding and acceptance.   Dental advisory given  Plan Discussed with: CRNA and Surgeon  Anesthesia Plan Comments: (Plan routine monitors, GETA)        Anesthesia Quick Evaluation

## 2015-06-04 NOTE — Transfer of Care (Signed)
Immediate Anesthesia Transfer of Care Note  Patient: Gavin Khan  Procedure(s) Performed: Procedure(s) with comments: ANTERIOR CERVICAL DECOMPRESSION/DISCECTOMY FUSION 3 LEVELS REMOVAL OF INSTRUMENTATION (N/A) - Anterior cervical decompression fusion, cervical 4-5, cervical 6-7, cervical 7-thoracic 1 with instrumentation and allograft; Removal of instrumentation C5-6.  Patient Location: PACU  Anesthesia Type:General  Level of Consciousness: awake, alert , oriented and patient cooperative  Airway & Oxygen Therapy: Patient Spontanous Breathing and Patient connected to nasal cannula oxygen  Post-op Assessment: Report given to RN and Post -op Vital signs reviewed and stable  Post vital signs: Reviewed and stable  Last Vitals:  Filed Vitals:   06/04/15 0703  BP: 166/75  Pulse: 103  Temp: 36.9 C  Resp: 20    Complications: No apparent anesthesia complications

## 2015-06-04 NOTE — Anesthesia Postprocedure Evaluation (Signed)
  Anesthesia Post-op Note  Patient: Gavin Khan  Procedure(s) Performed: Procedure(s) with comments: ANTERIOR CERVICAL DECOMPRESSION/DISCECTOMY FUSION 3 LEVELS REMOVAL OF INSTRUMENTATION (N/A) - Anterior cervical decompression fusion, cervical 4-5, cervical 6-7, cervical 7-thoracic 1 with instrumentation and allograft; Removal of instrumentation C5-6.  Patient Location: PACU  Anesthesia Type:General  Level of Consciousness: awake, alert , oriented and patient cooperative  Airway and Oxygen Therapy: Patient Spontanous Breathing  Post-op Pain: none  Post-op Assessment: Post-op Vital signs reviewed, Patient's Cardiovascular Status Stable, Respiratory Function Stable, Patent Airway, No signs of Nausea or vomiting and Pain level controlled LLE Motor Response: Purposeful movement, Responds to commands LLE Sensation: Full sensation, No numbness, No pain, No tingling RLE Motor Response: Purposeful movement, Responds to commands RLE Sensation: Other (Comment), No pain, Full sensation      Post-op Vital Signs: Reviewed and stable  Last Vitals:  Filed Vitals:   06/04/15 1615  BP: 143/86  Pulse: 97  Temp:   Resp: 19    Complications: No apparent anesthesia complications

## 2015-06-04 NOTE — Progress Notes (Signed)
Orthopedic Tech Progress Note Patient Details:  Gavin Khan 1976-07-25 [id number] Delivered [place] cervical collar to pt.'s nurse.  Ortho Devices Type of Ortho Device: Hard collar Ortho Device/Splint Interventions: Other (comment)   [name] 06/04/2015, 5:10 PM

## 2015-06-05 ENCOUNTER — Encounter (HOSPITAL_COMMUNITY): Payer: Self-pay | Admitting: Orthopedic Surgery

## 2015-06-05 DIAGNOSIS — M4803 Spinal stenosis, cervicothoracic region: Secondary | ICD-10-CM | POA: Diagnosis not present

## 2015-06-05 MED FILL — Thrombin For Soln 20000 Unit: CUTANEOUS | Qty: 1 | Status: AC

## 2015-06-05 NOTE — Progress Notes (Signed)
    Patient doing well Patient denies R or L arm pain Minimal neck pain Patient reports improved strength in left hand and arm   Physical Exam: Filed Vitals:   06/05/15 0400  BP: 123/74  Pulse: 89  Temp: 98.5 F (36.9 C)  Resp: 20    Dressing in place NVI Significant;y improved left hand strength noted  POD #1 s/p ACDF C4/5, C6/7, C7/T1  - encourage ambulation - Percocet for pain, Valium for muscle spasms - likely d/c home today

## 2015-06-05 NOTE — Progress Notes (Signed)
Pt and wife given D/C instructions with Rx's, verbal understanding was provided. Pt's incision is covered with gauze dressing and has no sign of infection. Pt's IV was removed prior to D/C. Pt D/C'd home via walking @ 0825 per MD order. Pt is stable @ D/C and has no other needs at this time. Rema Fendt, RN

## 2015-06-05 NOTE — Op Note (Signed)
NAMEJOHNPATRICK, JENNY NO.:  [id number]  MEDICAL RECORD NO.:  [id number]  LOCATION:  3C02C                        FACILITY:  [facility]  PHYSICIAN:  Estill Bamberg, MD      DATE OF BIRTH:  Feb 02, 1976  DATE OF PROCEDURE:  06/04/2015                              OPERATIVE REPORT   PREOPERATIVE DIAGNOSES: 1. 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 2. Left-sided neural foraminal stenosis, C4-5, C6-7, C7-T1. 3. Prominent left arm pain and weakness and numbness. 4. Status post previous C5-6 anterior cervical decompression and     fusion with retained hardware.  POSTOPERATIVE DIAGNOSES: 1. 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 2. Left-sided neural foraminal stenosis, C4-5, C6-7, C7-T1. 3. Prominent left arm pain and weakness and numbness. 4. Status post previous C5-6 anterior cervical decompression and     fusion with retained hardware.  PROCEDURE: 1. Anterior cervical decompression and fusion, C4-5, C6-7, C7-T1. 2. Placement of anterior instrumentation C4, C5, C6, C7, T1. 3. Removal and reinsertion of anterior instrumentation, C5, C6. 4. Insertion of interbody device x3 (Titan intervertebral spacers C4-     5, C6-7, C7-T1). 5. Exploration of spinal fusion, C5-6. 6. Intraoperative use of fluoroscopy.  SURGEON:  Estill Bamberg, MD  ASSISTANT:  Jason Coop, PA-C.  ANESTHESIA:  General endotracheal anesthesia.  COMPLICATIONS:  None.  DISPOSITION:  Stable.  ESTIMATED BLOOD LOSS:  Minimal.  INDICATIONS FOR SURGERY:  Briefly, Mr. Antonelli is a very pleasant 39- year-old male, who did present to me with pain and progressive weakness in the left arm.  The patient's MRI was notable for spinal cord compression at C4-5 and to a lesser extent C6-7 and C7-T1.  There was also noted to be left-sided neural foraminal compression at those levels.  The patient did have progressive weakness, pain, and numbness in the left arm.  Given his progressive symptomatology, we did  discuss proceeding with the procedure reflected above.  The patient did elect to proceed.  Of note, the patient was aware regaining.  The patient was aware that his weakness may potentially be permanent, and if his weakness were to improve, it would likely take time on the order of months or years.  OPERATIVE DETAILS:  On June 04, 2015, the patient was brought to surgery and general endotracheal anesthesia was administered.  The patient was placed supine on the hospital bed.  The neck was gently extended.  The arms were secured to the sides.  All bony prominences were meticulously padded.  The neck was prepped and draped in the usual sterile fashion. A left-sided oblique incision was made just anterior to the sternocleidomastoid muscle.  The platysma was incised.  There was abundant scar tissue noted at the upper aspect of the wound.  I was however able to develop a plane between the carotid artery and the esophagus.  The anterior spine was identified.  The previously placed C5- 6 anterior plate and screws were noted.  The plate and screws were removed uneventfully.  I did explore the C5-6 fusion by putting a Caspar pin into the bone above and below, and I did perform a pushing and pulling maneuver.  There was no motion noted across the intervertebral space, which  did confirm a solid fusion at the C5-6 level.  I then turned my attention to the C4-5 level.  A self-retaining retractor was placed and Caspar pins were placed into the C4 and C5 vertebral bodies and distraction was applied.  There were significant anterior osteophytes identified, which were removed.  A standard nerve root intervertebral diskectomy was performed.  There was a very prominent disk herniation noted to be occupying the spinal canal.  The herniation was removed in its entirety.  Bilateral neural foraminal decompression was then performed.  I then prepared the endplates and the appropriate- sized intervertebral  spacer was packed with DBX mix and tamped into position in the usual fashion.  I then turned my attention to the C7-T1 level.  Again, the Caspar pins were placed into the bone above and below and distraction was applied.  Again, a standard diskectomy was performed.  I was able to fully remove compression of the anterior spinal cord and the exiting left and right C8 nerves.  The appropriate- sized interbody spacer was then packed with the DBX mix and tamped into position.  The lower Caspar pin was removed, the bone wax was placed in its place.  I then turned my attention to the C6-7 intervertebral space. Again, distraction was applied using Caspar pins, and a standard diskectomy was performed.  Once again, a thorough bilateral neural foraminal decompression was performed and after the endplates were prepared, the appropriate size interbody spacer was packed with DBX mix and tamped into position.  I did use AP and lateral fluoroscopy while placing the implants, and was very pleased with the final resting position of the implants.  The appropriate-sized anterior cervical plate was placed over the anterior cervical spine in the vertebral bodies of C4, C7, and T1, 14 mm screws were placed through the plate.  At C5 and C6, I did re-instrument those particular levels, again using 14 mm screws.  I did note excellent purchase of each of the screws to the plate.  The screws were then locked to the plate using the CAM locking mechanism.  The wound was then copiously irrigated.  I was very pleased with the final AP and lateral fluoroscopic images.  The wound was explored for bleeding, and there were minor areas of bleeding, which was controlled using bipolar electrocautery.  The platysma was then closed using 2-0 Vicryl.  The skin was then closed using 3-0 Monocryl.  Benzoin and Steri-Strips were applied followed by sterile dressing.  All instrument counts were correct at the termination of the  procedure.  Of note, Jason Coop was my assistant throughout surgery, and did aid in retraction, suctioning, and closure throughout the surgery.     Estill Bamberg, MD     MD/MEDQ  D:  06/04/2015  T:  06/05/2015  Job:  [id number]

## 2015-06-06 ENCOUNTER — Encounter (HOSPITAL_COMMUNITY): Payer: Self-pay | Admitting: Orthopedic Surgery

## 2015-06-09 ENCOUNTER — Encounter (HOSPITAL_COMMUNITY): Payer: Self-pay | Admitting: Orthopedic Surgery

## 2015-06-12 NOTE — Discharge Summary (Signed)
Patient ID: Gavin Khan MRN: [id number] DOB/AGE: [birth year] 39 y.o.  Admit date: 06/04/2015 Discharge date: 06/05/2015  Admission Diagnoses:  Active Problems:   Myeloradiculopathy   Discharge Diagnoses:  Same  Past Medical History  Diagnosis Date  . Medical history non-contributory     Surgeries: Procedure(s): ANTERIOR CERVICAL DECOMPRESSION/DISCECTOMY FUSION 3 LEVELS C4-5, C6-T1 REMOVAL OF INSTRUMENTATION C5-6 on 06/04/2015   Consultants:  None  Discharged Condition: Improved  Hospital Course: Gavin Khan is an 39 y.o. male who was admitted 06/04/2015 for operative treatment of <principal problem not specified>. Patient has severe unremitting pain that affects sleep, daily activities, and work/hobbies. After pre-op clearance the patient was taken to the operating room on 06/04/2015 and underwent  Procedure(s): ANTERIOR CERVICAL DECOMPRESSION/DISCECTOMY FUSION C4-5, C6-T1 LEVELS REMOVAL OF INSTRUMENTATION.    Patient was given perioperative antibiotics:  Anti-infectives    Start     Dose/Rate Route Frequency Ordered Stop   06/04/15 2000  ceFAZolin (ANCEF) IVPB 1 g/50 mL premix     1 g 100 mL/hr over 30 Minutes Intravenous Every 8 hours 06/04/15 1707 06/05/15 0402   06/04/15 0800  ceFAZolin (ANCEF) IVPB 2 g/50 mL premix     2 g 100 mL/hr over 30 Minutes Intravenous To [facility] 06/03/15 1414 06/04/15 1216       Patient was given sequential compression devices, early ambulation to prevent DVT.  Patient benefited maximally from hospital stay and there were no complications.    Recent vital signs: BP 150/90 mmHg  Pulse 100  Temp(Src) 98.5 F (36.9 C) (Oral)  Resp 18  Wt 117.935 kg (260 lb)  SpO2 100%  Discharge Medications:     Medication List    TAKE these medications        lisinopril-hydrochlorothiazide 10-12.5 MG per tablet  Commonly known as:  PRINZIDE,ZESTORETIC  Take 1 tablet by mouth daily.     Vitamin D2 2000 UNITS Tabs  Take 2,000  Units by mouth daily.        Diagnostic Studies: Dg Cervical Spine 2-3 Views  06/04/2015   CLINICAL DATA:  Cervical spine fusion.  EXAM: DG C-ARM 61-120 MIN; CERVICAL SPINE - 2-3 VIEW  TECHNIQUE: Lateral intraoperative spot film obtained.  CONTRAST:  None.  FLUOROSCOPY TIME:  Fluoroscopy Time (in minutes and seconds): 0 minutes 12 seconds  Number of Acquired Images:  1  COMPARISON:  None.  FINDINGS: Anterior and interbody fusion C4 through C7. C7 difficult to visualize. Visualize structures appear in good anatomic alignment.  IMPRESSION: Postop changes as above.   Electronically Signed   By: Maisie Fushomas  Register   On: 06/04/2015 14:45   Dg C-arm 1-60 Min  06/04/2015   CLINICAL DATA:  Cervical spine fusion.  EXAM: DG C-ARM 61-120 MIN; CERVICAL SPINE - 2-3 VIEW  TECHNIQUE: Lateral intraoperative spot film obtained.  CONTRAST:  None.  FLUOROSCOPY TIME:  Fluoroscopy Time (in minutes and seconds): 0 minutes 12 seconds  Number of Acquired Images:  1  COMPARISON:  None.  FINDINGS: Anterior and interbody fusion C4 through C7. C7 difficult to visualize. Visualize structures appear in good anatomic alignment.  IMPRESSION: Postop changes as above.   Electronically Signed   By: Maisie Fushomas  Register   On: 06/04/2015 14:45    Disposition: 01-Home or Self Care   POD #1 s/p ACDF C4/5, C6/7, C7/T1  - encourage ambulation - Percocet for pain, Valium for muscle spasms -Written scripts for pain signed and in chart -D/C instructions sheet printed and in chart -D/C today  -  F/U in office 2 weeks   Signed: Georga Bora 06/12/2015, 11:51 AM

## 2017-05-10 ENCOUNTER — Encounter (HOSPITAL_COMMUNITY): Payer: Self-pay

## 2017-05-10 ENCOUNTER — Emergency Department (HOSPITAL_COMMUNITY)
Admission: EM | Admit: 2017-05-10 | Discharge: 2017-05-10 | Disposition: A | Discharge status: Home / Self Care | Payer: Managed Care, Other (non HMO) | Attending: Emergency Medicine | Admitting: Emergency Medicine

## 2017-05-10 DIAGNOSIS — Z79899 Other long term (current) drug therapy: Secondary | ICD-10-CM | POA: Insufficient documentation

## 2017-05-10 DIAGNOSIS — M5432 Sciatica, left side: Secondary | ICD-10-CM | POA: Diagnosis not present

## 2017-05-10 DIAGNOSIS — M545 Low back pain: Secondary | ICD-10-CM | POA: Diagnosis present

## 2017-05-10 MED ORDER — KETOROLAC TROMETHAMINE 30 MG/ML IJ SOLN
30.0000 mg | Freq: Once | INTRAMUSCULAR | Status: AC
  Administered 2017-05-10: 30 mg via INTRAVENOUS
  Filled 2017-05-10: qty 1

## 2017-05-10 MED ORDER — IBUPROFEN 600 MG PO TABS: 600.0000 mg | ORAL_TABLET | Freq: Four times a day (QID) | ORAL | 0 refills | Status: DC | PRN

## 2017-05-10 MED ORDER — METHOCARBAMOL 500 MG PO TABS: 500.0000 mg | ORAL_TABLET | Freq: Two times a day (BID) | ORAL | 0 refills | Status: DC

## 2017-05-10 MED ORDER — METHOCARBAMOL 500 MG PO TABS
1000.0000 mg | ORAL_TABLET | Freq: Once | ORAL | Status: AC
  Administered 2017-05-10: 1000 mg via ORAL
  Filled 2017-05-10: qty 2

## 2017-05-10 MED ORDER — LIDOCAINE 5 % EX PTCH: 1.0000 | MEDICATED_PATCH | CUTANEOUS | 0 refills | Status: AC

## 2017-05-10 MED ORDER — METHYLPREDNISOLONE SODIUM SUCC 125 MG IJ SOLR
125.0000 mg | Freq: Once | INTRAMUSCULAR | Status: AC
  Administered 2017-05-10: 125 mg via INTRAVENOUS
  Filled 2017-05-10: qty 2

## 2017-05-10 MED ORDER — HYDROCODONE-ACETAMINOPHEN 5-325 MG PO TABS: 1.0000 | ORAL_TABLET | Freq: Four times a day (QID) | ORAL | 0 refills | Status: DC | PRN

## 2017-05-10 NOTE — Discharge Instructions (Signed)
Take it easy, but do not lay around too much as this may make any stiffness worse.  Antiinflammatory medications: Take 500 mg of naproxen every 12 hours or 600 mg of ibuprofen every 6 hours for the next 3 days. Take these medications with food to avoid upset stomach. Choose only one of these medications, do not take them together.  Severe pain: May use the Vicodin as needed for severe pain. Do not drive or perform other dangerous activities while taking the Vicodin.  Muscle relaxer: Robaxin is a muscle relaxer and may help loosen stiff muscles. Do not take the Robaxin while driving or performing other dangerous activities.   Lidocaine patches: These are available via either prescription or over-the-counter. The over-the-counter option may be more economical one and are likely just as effective. There are multiple over-the-counter brands, such as Salonpas.  Exercises: Be sure to perform the attached exercises starting with three times a week and working up to performing them daily. This is an essential part of preventing long term problems.   Follow up with Dr. Marshell Levanumonski's office for any future management of this complaint.

## 2017-05-10 NOTE — ED Provider Notes (Signed)
[facility]-EMERGENCY DEPT Provider Note   CSN: [id number] Arrival date & time: 05/10/17  [id number]     History   Chief Complaint Chief Complaint  Patient presents with  . Back Pain    HPI Lin LandsmanBobby Carneal is a 41 y.o. male.  HPI    Lin LandsmanBobby Mcconaughey is a 41 y.o. male, with a history of myeloradiculopathy, presenting to the ED with lower left back pain beginning two weeks ago. Pain is worse in the morning and improves throughout the day and with ambulation. Pain is sharp/shooting, 6/10, extending down the left leg.   Patient saw Dr. Yevette Edwardsumonski for this issue on May 21 and was prescribed 6 day course of steroids. Called Dumonski's office on Friday, May 25 because pain was getting worse, but hasn't received a call back yet.   Denies N/V, changes in bowel or bladder function, fever/chills, falls/trauma, or any other complaints.      Past Medical History:  Diagnosis Date  . Medical history non-contributory     Patient Active Problem List   Diagnosis Date Noted  . Myeloradiculopathy 06/04/2015    Past Surgical History:  Procedure Laterality Date  . ANTERIOR CERVICAL DECOMP/DISCECTOMY FUSION N/A 06/04/2015   Procedure: ANTERIOR CERVICAL DECOMPRESSION/DISCECTOMY FUSION 3 LEVELS REMOVAL OF INSTRUMENTATION;  Surgeon: Estill BambergMark Dumonski, MD;  Location: [facility] OR;  Service: Orthopedics;  Laterality: N/A;  Anterior cervical decompression fusion, cervical 4-5, cervical 6-7, cervical 7-thoracic 1 with instrumentation and allograft; Removal of instrumentation C5-6.  Marland Kitchen. APPENDECTOMY    . BACK SURGERY    . NECK SURGERY         Home Medications    Prior to Admission medications   Medication Sig Start Date End Date Taking? Authorizing Provider  allopurinol (ALOPRIM) 500 MG injection Take 500 mg by mouth every morning.   Yes [provider]  cyclobenzaprine (FLEXERIL) 5 MG tablet Take 10 mg by mouth as needed for muscle spasms.  05/02/17  Yes [provider]  Ergocalciferol (VITAMIN D2) 2000  UNITS TABS Take 2,000 Units by mouth daily.   Yes [provider]  lisinopril-hydrochlorothiazide (PRINZIDE,ZESTORETIC) 10-12.5 MG per tablet Take 1 tablet by mouth daily.   Yes [provider]  methocarbamol (ROBAXIN) 500 MG tablet Take 500 mg by mouth 2 (two) times daily as needed for muscle spasms.  05/02/17  Yes [provider]  HYDROcodone-acetaminophen (NORCO/VICODIN) 5-325 MG tablet Take 1 tablet by mouth every 6 (six) hours as needed. 05/10/17   Joy, Shawn C, PA-C  ibuprofen (ADVIL,MOTRIN) 600 MG tablet Take 1 tablet (600 mg total) by mouth every 6 (six) hours as needed. 05/10/17   Joy, Shawn C, PA-C  lidocaine (LIDODERM) 5 % Place 1 patch onto the skin daily. Remove & Discard patch within 12 hours or as directed by MD 05/10/17   Joy, Shawn C, PA-C  methocarbamol (ROBAXIN) 500 MG tablet Take 1 tablet (500 mg total) by mouth 2 (two) times daily. 05/10/17   Joy, Hillard DankerShawn C, PA-C    Family History No family history on file.  Social History Social History  Substance Use Topics  . Smoking status: Never Smoker  . Smokeless tobacco: Never Used  . Alcohol use Yes     Comment: casual     Allergies   Patient has no known allergies.   Review of Systems Review of Systems  Constitutional: Negative for chills and fever.  Respiratory: Negative for shortness of breath.   Cardiovascular: Negative for chest pain.  Gastrointestinal: Negative for abdominal pain, nausea and  vomiting.  Musculoskeletal: Positive for back pain.  Neurological: Negative for weakness, numbness and headaches.  All other systems reviewed and are negative.    Physical Exam Updated Vital Signs BP (!) 151/101   Pulse (!) 110   Temp 97.7 F (36.5 C) (Oral)   Resp 16   Ht 5\' 11"  (1.803 m)   Wt 112.5 kg (248 lb)   SpO2 100%   BMI 34.59 kg/m   Physical Exam  Constitutional: He appears well-developed and well-nourished. No distress.  HENT:  Head: Normocephalic and atraumatic.  Eyes:  Conjunctivae are normal.  Neck: Neck supple.  Cardiovascular: Normal rate, regular rhythm and intact distal pulses.   Pulmonary/Chest: Effort normal. No respiratory distress.  Abdominal: Soft. There is no tenderness. There is no guarding.  Musculoskeletal: He exhibits no edema.  No noted tenderness. Normal motor function intact in all extremities and spine. No midline spinal tenderness.  Straight leg raise on left elicits pain into the left posterior hip and back.  Lymphadenopathy:    He has no cervical adenopathy.  Neurological: He is alert.  No sensory deficits noted in lower extremities. Strength 5/5 in both lower extremities. No gait disturbance. Coordination intact.  Skin: Skin is warm and dry. He is not diaphoretic.  Psychiatric: He has a normal mood and affect. His behavior is normal.  Nursing note and vitals reviewed.    ED Treatments / Results  Labs (all labs ordered are listed, but only abnormal results are displayed) Labs Reviewed - No data to display  EKG  EKG Interpretation None       Radiology No results found.  Procedures Procedures (including critical care time)  Medications Ordered in ED Medications  ketorolac (TORADOL) 30 MG/ML injection 30 mg (30 mg Intravenous Given 05/10/17 1000)  methylPREDNISolone sodium succinate (SOLU-MEDROL) 125 mg/2 mL injection 125 mg (125 mg Intravenous Given 05/10/17 0958)  methocarbamol (ROBAXIN) tablet 1,000 mg (1,000 mg Oral Given 05/10/17 1002)     Initial Impression / Assessment and Plan / ED Course  I have reviewed the triage vital signs and the nursing notes.  Pertinent labs & imaging results that were available during my care of the patient were reviewed by me and considered in my medical decision making (see chart for details).  Clinical Course as of May 10 1056  Tue May 10, 2017  1007 Patient called Dr. Marshell Levan office and was told he already has a MRI ordered and they are just waiting for insurance to approve  it.  [SJ]  1045 Patient's pain has improved to 4/10 while sitting and 2/10 while standing.  [SJ]    Clinical Course User Index [SJ] Joy, Shawn C, PA-C    Patient presents with lower back pain accompanied by symptoms of sciatica. He has no red flag symptoms. No neuro or functional deficits. Orthopedic follow-up. The patient was given instructions for home care as well as return precautions. Patient voices understanding of these instructions, accepts the plan, and is comfortable with discharge.  I attempted to make contact with Dr. Marshell Levan office to make them aware of patient's presentation to the ED and to inquire about a possible close follow up. I called twice, left a voicemail, and had not received a call back prior to patient discharge.   Vitals:   05/10/17 0859 05/10/17 0915 05/10/17 0930 05/10/17 1052  BP: (!) 167/112 (!) 151/101 (!) 155/102 (!) 142/104  Pulse: (!) 120 (!) 110 (!) 108 98  Resp: 16   18  Temp: 97.7  F (36.5 C)     TempSrc: Oral     SpO2: 100% 100% 99% 96%  Weight: 112.5 kg (248 lb)     Height: 5\' 11"  (1.803 m)        Final Clinical Impressions(s) / ED Diagnoses   Final diagnoses:  Sciatica of left side    New Prescriptions New Prescriptions   HYDROCODONE-ACETAMINOPHEN (NORCO/VICODIN) 5-325 MG TABLET    Take 1 tablet by mouth every 6 (six) hours as needed.   IBUPROFEN (ADVIL,MOTRIN) 600 MG TABLET    Take 1 tablet (600 mg total) by mouth every 6 (six) hours as needed.   LIDOCAINE (LIDODERM) 5 %    Place 1 patch onto the skin daily. Remove & Discard patch within 12 hours or as directed by MD   METHOCARBAMOL (ROBAXIN) 500 MG TABLET    Take 1 tablet (500 mg total) by mouth 2 (two) times daily.     Anselm Pancoast, PA-C 05/10/17 1058    Geoffery Lyons, MD 05/11/17 [phone]

## 2017-05-10 NOTE — ED Triage Notes (Signed)
Per Pt, Pt is coming from home with complaints of lower left back pain that started two weeks ago. Pt was seen at Orthopedic and given medication with no relief. Denies change in bowel or bladder.

## 2017-05-11 ENCOUNTER — Other Ambulatory Visit: Payer: Self-pay | Admitting: Orthopedic Surgery

## 2017-05-11 DIAGNOSIS — M5416 Radiculopathy, lumbar region: Secondary | ICD-10-CM

## 2017-05-17 ENCOUNTER — Ambulatory Visit
Admission: RE | Admit: 2017-05-17 | Discharge: 2017-05-17 | Disposition: A | Discharge status: Home / Self Care | Payer: Managed Care, Other (non HMO) | Source: Ambulatory Visit | Attending: Orthopedic Surgery | Admitting: Orthopedic Surgery

## 2017-05-17 DIAGNOSIS — M5416 Radiculopathy, lumbar region: Secondary | ICD-10-CM

## 2017-05-19 ENCOUNTER — Ambulatory Visit (INDEPENDENT_AMBULATORY_CARE_PROVIDER_SITE_OTHER): Payer: Self-pay | Admitting: Physical Medicine and Rehabilitation

## 2017-05-25 ENCOUNTER — Encounter (HOSPITAL_COMMUNITY): Payer: Self-pay | Admitting: Emergency Medicine

## 2017-05-25 ENCOUNTER — Emergency Department (HOSPITAL_COMMUNITY): Payer: Managed Care, Other (non HMO) | Admitting: Anesthesiology

## 2017-05-25 ENCOUNTER — Emergency Department (HOSPITAL_COMMUNITY)
Admission: EM | Disposition: A | Discharge status: Home / Self Care | Payer: Self-pay | Source: Home / Self Care | Attending: Emergency Medicine

## 2017-05-25 ENCOUNTER — Emergency Department (HOSPITAL_COMMUNITY): Payer: Managed Care, Other (non HMO)

## 2017-05-25 ENCOUNTER — Emergency Department (HOSPITAL_COMMUNITY)
Admission: EM | Admit: 2017-05-25 | Discharge: 2017-05-25 | Disposition: A | Discharge status: Home / Self Care | Payer: Managed Care, Other (non HMO) | Attending: Emergency Medicine | Admitting: Emergency Medicine

## 2017-05-25 ENCOUNTER — Inpatient Hospital Stay: Admit: 2017-05-25 | Payer: Managed Care, Other (non HMO) | Admitting: Orthopedic Surgery

## 2017-05-25 DIAGNOSIS — Z6834 Body mass index (BMI) 34.0-34.9, adult: Secondary | ICD-10-CM | POA: Diagnosis not present

## 2017-05-25 DIAGNOSIS — I10 Essential (primary) hypertension: Secondary | ICD-10-CM | POA: Diagnosis not present

## 2017-05-25 DIAGNOSIS — M5116 Intervertebral disc disorders with radiculopathy, lumbar region: Secondary | ICD-10-CM | POA: Insufficient documentation

## 2017-05-25 DIAGNOSIS — Z79899 Other long term (current) drug therapy: Secondary | ICD-10-CM | POA: Diagnosis not present

## 2017-05-25 DIAGNOSIS — M48061 Spinal stenosis, lumbar region without neurogenic claudication: Secondary | ICD-10-CM | POA: Diagnosis not present

## 2017-05-25 DIAGNOSIS — Z981 Arthrodesis status: Secondary | ICD-10-CM | POA: Insufficient documentation

## 2017-05-25 DIAGNOSIS — M545 Low back pain: Secondary | ICD-10-CM | POA: Diagnosis present

## 2017-05-25 DIAGNOSIS — M5442 Lumbago with sciatica, left side: Secondary | ICD-10-CM

## 2017-05-25 DIAGNOSIS — Z419 Encounter for procedure for purposes other than remedying health state, unspecified: Secondary | ICD-10-CM

## 2017-05-25 HISTORY — PX: LUMBAR LAMINECTOMY/DECOMPRESSION MICRODISCECTOMY: SHX5026

## 2017-05-25 HISTORY — DX: Essential (primary) hypertension: I10

## 2017-05-25 SURGERY — LUMBAR LAMINECTOMY/DECOMPRESSION MICRODISCECTOMY 1 LEVEL
Anesthesia: General | Laterality: Left

## 2017-05-25 MED ORDER — OXYCODONE HCL 5 MG/5ML PO SOLN: 5.0000 mg | Freq: Once | ORAL | Status: AC | PRN

## 2017-05-25 MED ORDER — HYDROMORPHONE HCL 1 MG/ML IJ SOLN
0.2500 mg | INTRAMUSCULAR | Status: DC | PRN
Start: 2017-05-25 — End: 2017-05-25

## 2017-05-25 MED ORDER — OXYCODONE HCL 5 MG PO TABS
5.0000 mg | ORAL_TABLET | Freq: Once | ORAL | Status: AC | PRN
  Administered 2017-05-25: 5 mg via ORAL

## 2017-05-25 MED ORDER — BUPIVACAINE-EPINEPHRINE (PF) 0.25% -1:200000 IJ SOLN
INTRAMUSCULAR | Status: AC
  Filled 2017-05-25: qty 30

## 2017-05-25 MED ORDER — VECURONIUM BROMIDE 10 MG IV SOLR
INTRAVENOUS | Status: DC | PRN
  Administered 2017-05-25: 4 mg via INTRAVENOUS

## 2017-05-25 MED ORDER — METHYLENE BLUE 0.5 % INJ SOLN
INTRAVENOUS | Status: AC
  Filled 2017-05-25: qty 10

## 2017-05-25 MED ORDER — DEXAMETHASONE SODIUM PHOSPHATE 10 MG/ML IJ SOLN
INTRAMUSCULAR | Status: DC | PRN
  Administered 2017-05-25: 10 mg via INTRAVENOUS

## 2017-05-25 MED ORDER — PHENYLEPHRINE HCL 10 MG/ML IJ SOLN
INTRAVENOUS | Status: DC | PRN
  Administered 2017-05-25: 30 ug/min via INTRAVENOUS

## 2017-05-25 MED ORDER — VECURONIUM BROMIDE 10 MG IV SOLR
INTRAVENOUS | Status: AC
  Filled 2017-05-25: qty 10

## 2017-05-25 MED ORDER — OXYCODONE HCL 5 MG PO TABS
ORAL_TABLET | ORAL | Status: AC
  Filled 2017-05-25: qty 1

## 2017-05-25 MED ORDER — ONDANSETRON HCL 4 MG/2ML IJ SOLN
INTRAMUSCULAR | Status: DC | PRN
Start: 2017-05-25 — End: 2017-05-25
  Administered 2017-05-25: 4 mg via INTRAVENOUS

## 2017-05-25 MED ORDER — PROPOFOL 10 MG/ML IV BOLUS
INTRAVENOUS | Status: DC | PRN
  Administered 2017-05-25: 230 mg via INTRAVENOUS

## 2017-05-25 MED ORDER — OXYCODONE-ACETAMINOPHEN 5-325 MG PO TABS
1.0000 | ORAL_TABLET | Freq: Once | ORAL | Status: AC
  Administered 2017-05-25: 1 via ORAL
  Filled 2017-05-25: qty 1

## 2017-05-25 MED ORDER — SUGAMMADEX SODIUM 200 MG/2ML IV SOLN
INTRAVENOUS | Status: AC
  Filled 2017-05-25: qty 2

## 2017-05-25 MED ORDER — LIDOCAINE 2% (20 MG/ML) 5 ML SYRINGE
INTRAMUSCULAR | Status: AC
  Filled 2017-05-25: qty 5

## 2017-05-25 MED ORDER — MIDAZOLAM HCL 2 MG/2ML IJ SOLN
INTRAMUSCULAR | Status: AC
  Filled 2017-05-25: qty 2

## 2017-05-25 MED ORDER — BUPIVACAINE LIPOSOME 1.3 % IJ SUSP
20.0000 mL | INTRAMUSCULAR | Status: DC
  Filled 2017-05-25: qty 20

## 2017-05-25 MED ORDER — THROMBIN 20000 UNITS EX SOLR
CUTANEOUS | Status: AC
  Filled 2017-05-25: qty 20000

## 2017-05-25 MED ORDER — LIDOCAINE HCL (CARDIAC) 20 MG/ML IV SOLN
INTRAVENOUS | Status: DC | PRN
  Administered 2017-05-25: 100 mg via INTRAVENOUS

## 2017-05-25 MED ORDER — FENTANYL CITRATE (PF) 250 MCG/5ML IJ SOLN
INTRAMUSCULAR | Status: AC
  Filled 2017-05-25: qty 10

## 2017-05-25 MED ORDER — DEXAMETHASONE SODIUM PHOSPHATE 10 MG/ML IJ SOLN
INTRAMUSCULAR | Status: AC
  Filled 2017-05-25: qty 1

## 2017-05-25 MED ORDER — ROCURONIUM BROMIDE 10 MG/ML (PF) SYRINGE
PREFILLED_SYRINGE | INTRAVENOUS | Status: AC
  Filled 2017-05-25: qty 5

## 2017-05-25 MED ORDER — METHYLPREDNISOLONE ACETATE 40 MG/ML IJ SUSP
INTRAMUSCULAR | Status: AC
  Filled 2017-05-25: qty 1

## 2017-05-25 MED ORDER — CEFAZOLIN SODIUM-DEXTROSE 2-3 GM-% IV SOLR
INTRAVENOUS | Status: DC | PRN
  Administered 2017-05-25: 2 g via INTRAVENOUS

## 2017-05-25 MED ORDER — KETOROLAC TROMETHAMINE 30 MG/ML IJ SOLN
60.0000 mg | Freq: Once | INTRAMUSCULAR | Status: AC
  Administered 2017-05-25: 60 mg via INTRAMUSCULAR
  Filled 2017-05-25: qty 2

## 2017-05-25 MED ORDER — THROMBIN 20000 UNITS EX KIT
PACK | CUTANEOUS | Status: DC | PRN
  Administered 2017-05-25: 20000 [IU] via TOPICAL

## 2017-05-25 MED ORDER — FENTANYL CITRATE (PF) 100 MCG/2ML IJ SOLN
INTRAMUSCULAR | Status: DC | PRN
  Administered 2017-05-25: 100 ug via INTRAVENOUS
  Administered 2017-05-25: 200 ug via INTRAVENOUS
  Administered 2017-05-25: 100 ug via INTRAVENOUS
  Administered 2017-05-25 (×2): 50 ug via INTRAVENOUS

## 2017-05-25 MED ORDER — DEXTROSE 5 % IV SOLN
INTRAVENOUS | Status: DC | PRN
  Administered 2017-05-25: 13:00:00 via INTRAVENOUS

## 2017-05-25 MED ORDER — METHYLPREDNISOLONE ACETATE 40 MG/ML IJ SUSP
INTRAMUSCULAR | Status: DC | PRN
  Administered 2017-05-25 (×2): 40 mg

## 2017-05-25 MED ORDER — EPHEDRINE SULFATE 50 MG/ML IJ SOLN
INTRAMUSCULAR | Status: DC | PRN
  Administered 2017-05-25: 5 mg via INTRAVENOUS

## 2017-05-25 MED ORDER — MIDAZOLAM HCL 5 MG/5ML IJ SOLN
INTRAMUSCULAR | Status: DC | PRN
  Administered 2017-05-25: 2 mg via INTRAVENOUS

## 2017-05-25 MED ORDER — ROCURONIUM BROMIDE 100 MG/10ML IV SOLN
INTRAVENOUS | Status: DC | PRN
  Administered 2017-05-25: 100 mg via INTRAVENOUS

## 2017-05-25 MED ORDER — SUGAMMADEX SODIUM 200 MG/2ML IV SOLN
INTRAVENOUS | Status: DC | PRN
  Administered 2017-05-25: 200 mg via INTRAVENOUS

## 2017-05-25 MED ORDER — HYDROMORPHONE HCL 1 MG/ML IJ SOLN
1.0000 mg | Freq: Once | INTRAMUSCULAR | Status: AC
  Administered 2017-05-25: 1 mg via INTRAVENOUS
  Filled 2017-05-25: qty 1

## 2017-05-25 MED ORDER — INDIGOTINDISULFONATE SODIUM 8 MG/ML IJ SOLN
INTRAMUSCULAR | Status: DC | PRN
  Administered 2017-05-25: .2 mL

## 2017-05-25 MED ORDER — BUPIVACAINE-EPINEPHRINE 0.25% -1:200000 IJ SOLN
INTRAMUSCULAR | Status: DC | PRN
  Administered 2017-05-25: 10 mL

## 2017-05-25 MED ORDER — ONDANSETRON HCL 4 MG/2ML IJ SOLN
INTRAMUSCULAR | Status: AC
  Filled 2017-05-25: qty 2

## 2017-05-25 MED ORDER — CEFAZOLIN SODIUM-DEXTROSE 2-4 GM/100ML-% IV SOLN
INTRAVENOUS | Status: AC
  Filled 2017-05-25: qty 100

## 2017-05-25 MED ORDER — LACTATED RINGERS IV SOLN
INTRAVENOUS | Status: DC
  Administered 2017-05-25 (×3): via INTRAVENOUS

## 2017-05-25 SURGICAL SUPPLY — 73 items
BENZOIN TINCTURE PRP APPL 2/3 (GAUZE/BANDAGES/DRESSINGS) IMPLANT
BUR ROUND PRECISION 4.0 (BURR) ×2 IMPLANT
BUR ROUND PRECISION 4.0MM (BURR) ×1
CANISTER SUCT 3000ML PPV (MISCELLANEOUS) ×3 IMPLANT
CARTRIDGE OIL MAESTRO DRILL (MISCELLANEOUS) ×1 IMPLANT
CLOSURE STERI-STRIP 1/2X4 (GAUZE/BANDAGES/DRESSINGS) ×1
CLOSURE WOUND 1/2 X4 (GAUZE/BANDAGES/DRESSINGS)
CLSR STERI-STRIP ANTIMIC 1/2X4 (GAUZE/BANDAGES/DRESSINGS) ×2 IMPLANT
CORDS BIPOLAR (ELECTRODE) ×3 IMPLANT
COVER SURGICAL LIGHT HANDLE (MISCELLANEOUS) ×3 IMPLANT
DERMABOND ADVANCED (GAUZE/BANDAGES/DRESSINGS) ×2
DERMABOND ADVANCED .7 DNX12 (GAUZE/BANDAGES/DRESSINGS) ×1 IMPLANT
DIFFUSER DRILL AIR PNEUMATIC (MISCELLANEOUS) ×3 IMPLANT
DRAIN CHANNEL 15F RND FF W/TCR (WOUND CARE) ×3 IMPLANT
DRAPE POUCH INSTRU U-SHP 10X18 (DRAPES) ×6 IMPLANT
DRAPE SURG 17X23 STRL (DRAPES) ×12 IMPLANT
DURAPREP 26ML APPLICATOR (WOUND CARE) ×3 IMPLANT
ELECT BLADE 4.0 EZ CLEAN MEGAD (MISCELLANEOUS)
ELECT CAUTERY BLADE 6.4 (BLADE) ×3 IMPLANT
ELECT REM PT RETURN 9FT ADLT (ELECTROSURGICAL) ×3
ELECTRODE BLDE 4.0 EZ CLN MEGD (MISCELLANEOUS) IMPLANT
ELECTRODE REM PT RTRN 9FT ADLT (ELECTROSURGICAL) ×1 IMPLANT
EVACUATOR SILICONE 100CC (DRAIN) ×3 IMPLANT
FILTER STRAW FLUID ASPIR (MISCELLANEOUS) ×3 IMPLANT
GAUZE SPONGE 4X4 12PLY STRL (GAUZE/BANDAGES/DRESSINGS) ×3 IMPLANT
GAUZE SPONGE 4X4 16PLY XRAY LF (GAUZE/BANDAGES/DRESSINGS) IMPLANT
GLOVE BIO SURGEON STRL SZ7 (GLOVE) ×3 IMPLANT
GLOVE BIO SURGEON STRL SZ8 (GLOVE) ×3 IMPLANT
GLOVE BIOGEL PI IND STRL 7.0 (GLOVE) ×1 IMPLANT
GLOVE BIOGEL PI IND STRL 8 (GLOVE) ×1 IMPLANT
GLOVE BIOGEL PI INDICATOR 7.0 (GLOVE) ×2
GLOVE BIOGEL PI INDICATOR 8 (GLOVE) ×2
GOWN STRL REUS W/ TWL LRG LVL3 (GOWN DISPOSABLE) ×1 IMPLANT
GOWN STRL REUS W/ TWL XL LVL3 (GOWN DISPOSABLE) ×2 IMPLANT
GOWN STRL REUS W/TWL LRG LVL3 (GOWN DISPOSABLE) ×2
GOWN STRL REUS W/TWL XL LVL3 (GOWN DISPOSABLE) ×4
IV CATH 14GX2 1/4 (CATHETERS) ×3 IMPLANT
KIT BASIN OR (CUSTOM PROCEDURE TRAY) ×3 IMPLANT
KIT POSITION SURG JACKSON T1 (MISCELLANEOUS) ×3 IMPLANT
KIT ROOM TURNOVER OR (KITS) ×3 IMPLANT
NEEDLE 18GX1X1/2 (RX/OR ONLY) (NEEDLE) ×3 IMPLANT
NEEDLE 22X1 1/2 (OR ONLY) (NEEDLE) ×3 IMPLANT
NEEDLE HYPO 25GX1X1/2 BEV (NEEDLE) ×3 IMPLANT
NEEDLE SPNL 18GX3.5 QUINCKE PK (NEEDLE) ×6 IMPLANT
NS IRRIG 1000ML POUR BTL (IV SOLUTION) ×3 IMPLANT
OIL CARTRIDGE MAESTRO DRILL (MISCELLANEOUS) ×3
PACK LAMINECTOMY ORTHO (CUSTOM PROCEDURE TRAY) ×3 IMPLANT
PACK UNIVERSAL I (CUSTOM PROCEDURE TRAY) ×3 IMPLANT
PAD ARMBOARD 7.5X6 YLW CONV (MISCELLANEOUS) ×6 IMPLANT
PATTIES SURGICAL .5 X.5 (GAUZE/BANDAGES/DRESSINGS) IMPLANT
PATTIES SURGICAL .5 X1 (DISPOSABLE) ×3 IMPLANT
SPONGE INTESTINAL PEANUT (DISPOSABLE) ×3 IMPLANT
SPONGE SURGIFOAM ABS GEL 100 (HEMOSTASIS) ×3 IMPLANT
SPONGE SURGIFOAM ABS GEL SZ50 (HEMOSTASIS) ×3 IMPLANT
STRIP CLOSURE SKIN 1/2X4 (GAUZE/BANDAGES/DRESSINGS) IMPLANT
SURGIFLO W/THROMBIN 8M KIT (HEMOSTASIS) IMPLANT
SUT MNCRL AB 4-0 PS2 18 (SUTURE) ×3 IMPLANT
SUT VIC AB 0 CT1 18XCR BRD 8 (SUTURE) IMPLANT
SUT VIC AB 0 CT1 27 (SUTURE)
SUT VIC AB 0 CT1 27XBRD ANBCTR (SUTURE) IMPLANT
SUT VIC AB 0 CT1 8-18 (SUTURE)
SUT VIC AB 1 CT1 18XCR BRD 8 (SUTURE) ×1 IMPLANT
SUT VIC AB 1 CT1 8-18 (SUTURE) ×2
SUT VIC AB 2-0 CT2 18 VCP726D (SUTURE) ×3 IMPLANT
SYR 20CC LL (SYRINGE) IMPLANT
SYR BULB IRRIGATION 50ML (SYRINGE) ×3 IMPLANT
SYR CONTROL 10ML LL (SYRINGE) ×6 IMPLANT
SYR TB 1ML 26GX3/8 SAFETY (SYRINGE) ×6 IMPLANT
SYR TB 1ML LUER SLIP (SYRINGE) ×6 IMPLANT
TOWEL OR 17X24 6PK STRL BLUE (TOWEL DISPOSABLE) ×3 IMPLANT
TOWEL OR 17X26 10 PK STRL BLUE (TOWEL DISPOSABLE) ×3 IMPLANT
WATER STERILE IRR 1000ML POUR (IV SOLUTION) ×3 IMPLANT
YANKAUER SUCT BULB TIP NO VENT (SUCTIONS) ×3 IMPLANT

## 2017-05-25 NOTE — Anesthesia Procedure Notes (Signed)
Procedure Name: Intubation Performed by: Wray KearnsFOLEY, Tamir Wallman A Pre-anesthesia Checklist: Patient identified and Suction available Patient Re-evaluated:Patient Re-evaluated prior to inductionOxygen Delivery Method: Circle system utilized Preoxygenation: Pre-oxygenation with 100% oxygen Intubation Type: IV induction Ventilation: Oral airway inserted - appropriate to patient size and Mask ventilation without difficulty Laryngoscope Size: Miller and 2 Grade View: Grade I Tube type: Oral Tube size: 8.0 mm Number of attempts: 1 Airway Equipment and Method: Stylet Placement Confirmation: ETT inserted through vocal cords under direct vision,  positive ETCO2 and breath sounds checked- equal and bilateral Secured at: 24 cm Tube secured with: Tape Dental Injury: Teeth and Oropharynx as per pre-operative assessment

## 2017-05-25 NOTE — Consult Note (Signed)
Reason for Consult:Left leg pain Referring Physician: ED physician and Lester KinsmanSamantha Tripp, PA-C  Lin LandsmanBobby Khan is an 41 y.o. male.  HPI: Patient presents with increasing and severe left leg pain. Pain is rated at 10/10. Patient is unable to ambulate without severe pain. Pain has been getting worse. Pain is a stabbing sensation.   Past Medical History:  Diagnosis Date  . Hypertension   . Medical history non-contributory     Past Surgical History:  Procedure Laterality Date  . ANTERIOR CERVICAL DECOMP/DISCECTOMY FUSION N/A 06/04/2015   Procedure: ANTERIOR CERVICAL DECOMPRESSION/DISCECTOMY FUSION 3 LEVELS REMOVAL OF INSTRUMENTATION;  Surgeon: Estill BambergMark Mariabelen Pressly, MD;  Location: [facility] OR;  Service: Orthopedics;  Laterality: N/A;  Anterior cervical decompression fusion, cervical 4-5, cervical 6-7, cervical 7-thoracic 1 with instrumentation and allograft; Removal of instrumentation C5-6.  Marland Kitchen. APPENDECTOMY    . BACK SURGERY    . NECK SURGERY      No family history on file.  Social History:  reports that he has never smoked. He has never used smokeless tobacco. He reports that he drinks alcohol. He reports that he does not use drugs.  Allergies: No Known Allergies  Medications: I have reviewed the patient's current medications.  No results found for this or any previous visit (from the past 48 hour(s)).  No results found.  Review of Systems  Constitutional: Negative.   HENT: Negative.   Eyes: Negative.   Cardiovascular: Negative.   Skin: Negative.    Blood pressure (!) 178/111, pulse 89, temperature 99.2 F (37.3 C), temperature source Oral, resp. rate 16, height 5\' 11"  (1.803 m), weight 112.5 kg (248 lb), SpO2 96 %. Physical Exam  Constitutional: He appears well-developed and well-nourished.  HENT:  Head: Normocephalic.  Eyes: Pupils are equal, round, and reactive to light.  Neck: Normal range of motion.  Cardiovascular: Normal rate.   Respiratory: Effort normal.  GI: Soft.   Musculoskeletal: Normal range of motion.  Neurological: He displays abnormal reflex.  + SLR on the left Diminished left knee reflex   Skin: Skin is warm and dry.   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 MRI corresponds to his pain. We did previously discuss proceeding with an elective discectomy, but given his severe increase in pain, we will proceed with an urgent microdiscectomy today. Maintain patient NPO. He does understand risks, etc associated with surgery which were discussed at his last office visit. Will proceed later today.  [name] LEONARD 05/25/2017, 8:33 AM

## 2017-05-25 NOTE — ED Provider Notes (Signed)
[facility]-EMERGENCY DEPT Provider Note   CSN: [id number] Arrival date & time: 05/25/17  0557     History   Chief Complaint Chief Complaint  Patient presents with  . Back Pain    HPI Gavin Khan is a 41 y.o. male with a pmhx of myeloradiculopathy, s/p cervical discectomy who presents to the ED today complaining of back pain. Pt states that he is having severe left lower back pain that radiates into his left leg. He has pain from his left knee to his left with associated tingling sensation on the top of his left foot. No bowel or bladder incontinence or saddle anesthesia. No known trauma or injury. Pain is worse with sitting and improved with walking. Pt is closely followed by Dr. Yevette Khan for this problem. He had MRI lumbar spine on 05/17/17 which revealed the dominant L sided abnormality is at L3-l4 where a central leftward extrusion, along with a caudally migrated free fragment on the L causes a moderate to severe stenosis as well as a a L >R L 4 nerve root impingement. Even larger recurrent disc extrusion is seen at L4-5, central and to the R, also caudally migrated free fragment. 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 Central and rightward protrusion L5-S1. R >L S1 nerve root compromise.   HPI  Past Medical History:  Diagnosis Date  . Hypertension   . Medical history non-contributory     Patient Active Problem List   Diagnosis Date Noted  . Myeloradiculopathy 06/04/2015    Past Surgical History:  Procedure Laterality Date  . ANTERIOR CERVICAL DECOMP/DISCECTOMY FUSION N/A 06/04/2015   Procedure: ANTERIOR CERVICAL DECOMPRESSION/DISCECTOMY FUSION 3 LEVELS REMOVAL OF INSTRUMENTATION;  Surgeon: Estill BambergMark Dumonski, MD;  Location: [facility] OR;  Service: Orthopedics;  Laterality: N/A;  Anterior cervical decompression fusion, cervical 4-5, cervical 6-7, cervical 7-thoracic 1 with instrumentation and allograft; Removal of instrumentation C5-6.  Marland Kitchen. APPENDECTOMY    . BACK SURGERY    . NECK  SURGERY         Home Medications    Prior to Admission medications   Medication Sig Start Date End Date Taking? Authorizing Provider  allopurinol (ALOPRIM) 500 MG injection Take 500 mg by mouth every morning.    [provider]  cyclobenzaprine (FLEXERIL) 5 MG tablet Take 10 mg by mouth as needed for muscle spasms.  05/02/17   [provider]  Ergocalciferol (VITAMIN D2) 2000 UNITS TABS Take 2,000 Units by mouth daily.    [provider]  HYDROcodone-acetaminophen (NORCO/VICODIN) 5-325 MG tablet Take 1 tablet by mouth every 6 (six) hours as needed. 05/10/17   Joy, Shawn C, PA-C  ibuprofen (ADVIL,MOTRIN) 600 MG tablet Take 1 tablet (600 mg total) by mouth every 6 (six) hours as needed. 05/10/17   Joy, Shawn C, PA-C  lidocaine (LIDODERM) 5 % Place 1 patch onto the skin daily. Remove & Discard patch within 12 hours or as directed by MD 05/10/17   Joy, Shawn C, PA-C  lisinopril-hydrochlorothiazide (PRINZIDE,ZESTORETIC) 10-12.5 MG per tablet Take 1 tablet by mouth daily.    [provider]  methocarbamol (ROBAXIN) 500 MG tablet Take 500 mg by mouth 2 (two) times daily as needed for muscle spasms.  05/02/17   [provider]  methocarbamol (ROBAXIN) 500 MG tablet Take 1 tablet (500 mg total) by mouth 2 (two) times daily. 05/10/17   Joy, Hillard DankerShawn C, PA-C    Family History No family history on file.  Social History Social History  Substance Use Topics  .  Smoking status: Never Smoker  . Smokeless tobacco: Never Used  . Alcohol use Yes     Comment: casual     Allergies   Patient has no known allergies.   Review of Systems Review of Systems  All other systems reviewed and are negative.    Physical Exam Updated Vital Signs BP (!) 170/116 (BP Location: Right Arm)   Pulse (!) 106   Temp 99.2 F (37.3 C) (Oral)   Resp 20   Ht 5\' 11"  (1.803 m)   Wt 112.5 kg (248 lb)   SpO2 97%   BMI 34.59 kg/m   Physical Exam  Constitutional: He is  oriented to person, place, and time. He appears well-developed and well-nourished. No distress.  HENT:  Head: Normocephalic and atraumatic.  Eyes: Conjunctivae are normal. Right eye exhibits no discharge. Left eye exhibits no discharge. No scleral icterus.  Cardiovascular: Normal rate.   Pulmonary/Chest: Effort normal.  Musculoskeletal:  Lumbar midline spinal TTP with L paraspinal muscle  Tenderness. FROM of C, T, L spine. No step offs or obvious bony deformities. Negative SLR.    Neurological: He is alert and oriented to person, place, and time. Coordination normal.  No gait abnormality.   Skin: Skin is warm and dry. No rash noted. He is not diaphoretic. No erythema. No pallor.  Psychiatric: He has a normal mood and affect. His behavior is normal.  Nursing note and vitals reviewed.    ED Treatments / Results  Labs (all labs ordered are listed, but only abnormal results are displayed) Labs Reviewed - No data to display  EKG  EKG Interpretation None       Radiology No results found.  Procedures Procedures (including critical care time)  Medications Ordered in ED Medications  oxyCODONE-acetaminophen (PERCOCET/ROXICET) 5-325 MG per tablet 1 tablet (not administered)  ketorolac (TORADOL) 30 MG/ML injection 60 mg (not administered)     Initial Impression / Assessment and Plan / ED Course  I have reviewed the triage vital signs and the nursing notes.  Pertinent labs & imaging results that were available during my care of the patient were reviewed by me and considered in my medical decision making (see chart for details).     41 year old male with history of lower back pain, followed closely by Dr. Ottis Stain with recent MRI showing severe stenosis presents with worsening left lower back pain radiating into his left leg. On Arrival to ED he is tachycardic and hypertensive, likely secondary to severe pain. He was given Percocet and IM Toradol with minimal relief in his  symptoms. Dr. Ottis Stain consult the patient in ED and plans to admit him with plans for discectomy later today.  Pt remains HDS and awaiting admission.   Final Clinical Impressions(s) / ED Diagnoses   Final diagnoses:  Acute left-sided low back pain with left-sided sciatica    New Prescriptions New Prescriptions   No medications on file     Dub Mikes, PA-C 05/25/17 1610    Shon Baton, MD 05/28/17 [phone]

## 2017-05-25 NOTE — Anesthesia Preprocedure Evaluation (Signed)
Anesthesia Evaluation  Patient identified by MRN, date of birth, ID band Patient awake    Reviewed: Allergy & Precautions, NPO status , Patient's Chart, lab work & pertinent test results  History of Anesthesia Complications Negative for: history of anesthetic complications  Airway Mallampati: I  TM Distance: >3 FB Neck ROM: Full    Dental  (+) Dental Advisory Given, Teeth Intact   Pulmonary neg pulmonary ROS,    breath sounds clear to auscultation       Cardiovascular hypertension, Pt. on medications  Rhythm:Regular Rate:Normal     Neuro/Psych  Neuromuscular disease    GI/Hepatic Neg liver ROS,   Endo/Other  Morbid obesity  Renal/GU negative Renal ROS     Musculoskeletal   Abdominal (+) + obese,   Peds  Hematology negative hematology ROS (+)   Anesthesia Other Findings   Reproductive/Obstetrics                             Anesthesia Physical Anesthesia Plan  ASA: II  Anesthesia Plan: General   Post-op Pain Management:    Induction: Intravenous  PONV Risk Score and Plan: 2 and Ondansetron and Dexamethasone  Airway Management Planned: Oral ETT  Additional Equipment: None  Intra-op Plan:   Post-operative Plan: Extubation in OR  Informed Consent: I have reviewed the patients History and Physical, chart, labs and discussed the procedure including the risks, benefits and alternatives for the proposed anesthesia with the patient or authorized representative who has indicated his/her understanding and acceptance.   Dental advisory given  Plan Discussed with: CRNA and Surgeon  Anesthesia Plan Comments:         Anesthesia Quick Evaluation

## 2017-05-25 NOTE — Op Note (Signed)
Gavin Khan, Gavin Khan NO.:  [id number]  MEDICAL RECORD NO.:  [id number]  PHYSICIAN:  Estill Bamberg, MD      DATE OF BIRTH:  12/24/1975  DATE OF PROCEDURE:  05/25/2017                               OPERATIVE REPORT   PREOPERATIVE DIAGNOSES: 1. Severe left L4 radiculopathy. 2. 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  POSTOPERATIVE DIAGNOSES: 1. Severe left L4 radiculopathy. 2. 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  PROCEDURES PERFORMED:  Left-sided L3-4 laminotomy with partial facetectomy and removal of the extruded L3-4 disk fragment.  SURGEON:  Estill Bamberg, MD  ASSISTANT:  Gavin Coop, PA-C.  ANESTHESIA:  General endotracheal anesthesia.  COMPLICATIONS:  None.  DISPOSITION:  Stable.  ESTIMATED BLOOD LOSS:  Minimal.  INDICATIONS FOR SURGERY:  Briefly, Mr. Gavin Khan is a pleasant 41 year old male, who I have been following for rather severe pain in his left leg. I did see him in the office just yesterday, on May 24, 2017, and we did discuss proceeding with an elective microdiskectomy procedure.  However, he did present to the emergency department earlier this morning with severe and intractable pain.  He did not feel that he was able to ambulate and take care of himself.  We therefore did discuss proceeding with the procedure reflected above in an urgent fashion.  The patient was maintained on an n.p.o. status and was fully aware of the risks and limitations of surgery.  Of particular notes, the patient was taking ibuprofen up until the day prior to surgery.  I did express to him that this does increase his risk for a postoperative bleeding which may require additional surgery.  OPERATIVE DETAILS:  On May 25, 2017, the patient was brought to surgery and general endotracheal anesthesia was administered.  The patient was placed prone on a  well-padded flat Jackson bed with a spinal frame. Antibiotics were given and a time-out procedure was performed.  The back was prepped and draped and a midline incision was made just above his previous incision.  The fascia was incised in a curvilinear fashion just to the left of the midline.  A self-retaining McCulloch retractor was placed.  Using Kerrison punches, I did remove the medial and inferior aspect of the L3 lamina.  The ligamentum flavum was identified and removed as well.  The traversing left L4 nerve was identified and noted to be under substantial tension.  I was able to medially retract the nerve, after which point an obvious extruded disk fragment was encountered and was removed in 1 rather large fragment.  I did explore the wound and lateral recess for any additional disk fragments, and there were no additional fragments encountered.  I was able to easily mobilize the nerve medially and laterally after removing the disk fragment, which did confirm appropriate decompression of the nerve.  The wound was then copiously irrigated.  Depo-Medrol 20 mg, was then introduced about the epidural space.  At this point, I did explore the wound to ensure that there was no bleeding or oozing encountered, given the fact that the patient was taking ibuprofen.  There were small bleeding vessels which were controlled using bipolar electrocautery. The wound was additionally  explored and there was no active bleeding encountered and the wound was noted to be very dry.  At this point, I did proceed with closure.  The fascia was closed using #1 Vicryl.  The subcutaneous layer was closed using 2-0 Vicryl and the skin was closed using 4-0 Monocryl.  Benzoin and Steri-Strips were applied followed by sterile dressing.  All instrument counts were correct at the termination of the procedure.  Of note, Gavin CoopKayla Khan, was my assistant throughout the surgery, and did aid in retraction, suctioning, and  closure from start to finish.     Estill BambergMark Edwinna Rochette, MD   ______________________________ Estill BambergMark Moria Brophy, MD    MD/MEDQ  D:  05/25/2017  T:  05/25/2017  Job:  [id number]

## 2017-05-25 NOTE — Transfer of Care (Signed)
Immediate Anesthesia Transfer of Care Note  Patient: Gavin LandsmanBobby Khan  Procedure(s) Performed: Procedure(s): Lumbar three- four LUMBAR LAMINECTOMY/DECOMPRESSION MICRODISCECTOMY 1 LEVEL (Left)  Patient Location: PACU  Anesthesia Type:General  Level of Consciousness: awake, oriented, sedated, patient cooperative and responds to stimulation  Airway & Oxygen Therapy: Patient Spontanous Breathing and Patient connected to nasal cannula oxygen  Post-op Assessment: Report given to RN, Post -op Vital signs reviewed and stable, Patient moving all extremities and Patient moving all extremities X 4  Post vital signs: Reviewed and stable  Last Vitals:  Vitals:   05/25/17 1035 05/25/17 1452  BP: (!) 147/93   Pulse: 84   Resp: 16   Temp: 36.8 C 36.8 C    Last Pain:  Vitals:   05/25/17 1113  TempSrc:   PainSc: 4          Complications: No apparent anesthesia complications

## 2017-05-25 NOTE — Anesthesia Postprocedure Evaluation (Signed)
Anesthesia Post Note  Patient: Gavin LandsmanBobby Khan  Procedure(s) Performed: Procedure(s) (LRB): Lumbar three- four LUMBAR LAMINECTOMY/DECOMPRESSION MICRODISCECTOMY 1 LEVEL (Left)     Patient location during evaluation: PACU Anesthesia Type: General Level of consciousness: awake and alert Pain management: pain level controlled Vital Signs Assessment: post-procedure vital signs reviewed and stable Respiratory status: spontaneous breathing, nonlabored ventilation, respiratory function stable and patient connected to nasal cannula oxygen Cardiovascular status: blood pressure returned to baseline and stable Postop Assessment: no signs of nausea or vomiting Anesthetic complications: no    Last Vitals:  Vitals:   05/25/17 1452 05/25/17 1500  BP: (!) 143/93   Pulse: 94 88  Resp: 15 10  Temp: 36.8 C     Last Pain:  Vitals:   05/25/17 1452  TempSrc:   PainSc: 0-No pain                 Markian Glockner S

## 2017-05-25 NOTE — ED Triage Notes (Signed)
Patient here with lower back pain, states that he is under the care of Dr Yevette Edwardsumonski, has been doing multiple different therapies.  He states that he has had 2 ibuprofen yesterday am, hydrocodone last night at 10pm.  Some muscle relaxants not working for him (robaxin and flexeril).  No changes in bowel or bladder control.

## 2017-05-26 ENCOUNTER — Encounter (HOSPITAL_COMMUNITY): Payer: Self-pay | Admitting: Orthopedic Surgery

## 2017-05-26 MED FILL — Thrombin For Soln 20000 Unit: CUTANEOUS | Qty: 1 | Status: AC

## 2018-06-04 ENCOUNTER — Emergency Department (HOSPITAL_COMMUNITY): Payer: Managed Care, Other (non HMO)

## 2018-06-04 ENCOUNTER — Encounter (HOSPITAL_COMMUNITY): Payer: Self-pay | Admitting: Emergency Medicine

## 2018-06-04 ENCOUNTER — Observation Stay (HOSPITAL_COMMUNITY)
Admission: EM | Admit: 2018-06-04 | Discharge: 2018-06-08 | Disposition: A | Discharge status: Home / Self Care | Payer: Managed Care, Other (non HMO) | Attending: Orthopedic Surgery | Admitting: Orthopedic Surgery

## 2018-06-04 ENCOUNTER — Other Ambulatory Visit: Payer: Self-pay

## 2018-06-04 DIAGNOSIS — M47812 Spondylosis without myelopathy or radiculopathy, cervical region: Secondary | ICD-10-CM | POA: Diagnosis not present

## 2018-06-04 DIAGNOSIS — G959 Disease of spinal cord, unspecified: Secondary | ICD-10-CM | POA: Diagnosis not present

## 2018-06-04 DIAGNOSIS — R262 Difficulty in walking, not elsewhere classified: Secondary | ICD-10-CM | POA: Insufficient documentation

## 2018-06-04 DIAGNOSIS — W108XXA Fall (on) (from) other stairs and steps, initial encounter: Secondary | ICD-10-CM | POA: Diagnosis not present

## 2018-06-04 DIAGNOSIS — Y9301 Activity, walking, marching and hiking: Secondary | ICD-10-CM | POA: Diagnosis not present

## 2018-06-04 DIAGNOSIS — Y92831 Amusement park as the place of occurrence of the external cause: Secondary | ICD-10-CM | POA: Diagnosis not present

## 2018-06-04 DIAGNOSIS — S14121A Central cord syndrome at C1 level of cervical spinal cord, initial encounter: Secondary | ICD-10-CM | POA: Diagnosis present

## 2018-06-04 DIAGNOSIS — M4802 Spinal stenosis, cervical region: Secondary | ICD-10-CM | POA: Diagnosis not present

## 2018-06-04 DIAGNOSIS — M5410 Radiculopathy, site unspecified: Secondary | ICD-10-CM | POA: Diagnosis present

## 2018-06-04 DIAGNOSIS — I10 Essential (primary) hypertension: Secondary | ICD-10-CM | POA: Insufficient documentation

## 2018-06-04 DIAGNOSIS — S14109A Unspecified injury at unspecified level of cervical spinal cord, initial encounter: Secondary | ICD-10-CM | POA: Diagnosis present

## 2018-06-04 DIAGNOSIS — Z419 Encounter for procedure for purposes other than remedying health state, unspecified: Secondary | ICD-10-CM

## 2018-06-04 LAB — BASIC METABOLIC PANEL
ANION GAP: 7 (ref 5–15)
BUN: 14 mg/dL (ref 6–20)
CALCIUM: 9.6 mg/dL (ref 8.9–10.3)
CO2: 29 mmol/L (ref 22–32)
CREATININE: 1.18 mg/dL (ref 0.61–1.24)
Chloride: 106 mmol/L (ref 101–111)
GFR calc Af Amer: >60 mL/min (ref >60)
GFR calc non Af Amer: >60 mL/min (ref >60)
GLUCOSE: 116 mg/dL — AB (ref 65–99)
Potassium: 4.1 mmol/L (ref 3.5–5.1)
Sodium: 142 mmol/L (ref 135–145)

## 2018-06-04 LAB — CBC
HCT: 39.1 % (ref 39.0–52.0)
Hemoglobin: 13.2 g/dL (ref 13.0–17.0)
MCH: 28.3 pg (ref 26.0–34.0)
MCHC: 33.8 g/dL (ref 30.0–36.0)
MCV: 83.7 fL (ref 78.0–100.0)
Platelets: 234 10*3/uL (ref 150–400)
RBC: 4.67 MIL/uL (ref 4.22–5.81)
RDW: 14.3 % (ref 11.5–15.5)
WBC: 8.1 10*3/uL (ref 4.0–10.5)

## 2018-06-04 MED ORDER — HYDROMORPHONE HCL 1 MG/ML IJ SOLN: 0.5000 mg | INTRAMUSCULAR | Status: DC | PRN

## 2018-06-04 MED ORDER — HYDROMORPHONE HCL 2 MG/ML IJ SOLN: 0.5000 mg | INTRAMUSCULAR | Status: DC | PRN

## 2018-06-04 MED ORDER — METHOCARBAMOL 1000 MG/10ML IJ SOLN
500.0000 mg | Freq: Four times a day (QID) | INTRAVENOUS | Status: DC | PRN
  Filled 2018-06-04: qty 5

## 2018-06-04 MED ORDER — LORAZEPAM 2 MG/ML IJ SOLN
2.0000 mg | Freq: Four times a day (QID) | INTRAMUSCULAR | Status: DC | PRN
  Administered 2018-06-04: 2 mg via INTRAVENOUS
  Filled 2018-06-04: qty 1

## 2018-06-04 MED ORDER — HYDROCODONE-ACETAMINOPHEN 5-325 MG PO TABS
1.0000 | ORAL_TABLET | ORAL | Status: DC | PRN
  Administered 2018-06-05: 2 via ORAL
  Administered 2018-06-05 (×2): 1 via ORAL
  Filled 2018-06-04 (×2): qty 1
  Filled 2018-06-04: qty 2

## 2018-06-04 MED ORDER — DEXAMETHASONE SODIUM PHOSPHATE 10 MG/ML IJ SOLN
10.0000 mg | Freq: Four times a day (QID) | INTRAMUSCULAR | Status: DC
  Administered 2018-06-04 – 2018-06-07 (×10): 10 mg via INTRAVENOUS
  Filled 2018-06-04 (×10): qty 1

## 2018-06-04 MED ORDER — SODIUM CHLORIDE 0.9 % IV SOLN
INTRAVENOUS | Status: DC
  Administered 2018-06-04: 14:00:00 via INTRAVENOUS

## 2018-06-04 MED ORDER — SODIUM CHLORIDE 0.9 % IV SOLN
INTRAVENOUS | Status: DC
  Administered 2018-06-04: 23:00:00 via INTRAVENOUS
  Administered 2018-06-05: 75 mL/h via INTRAVENOUS
  Administered 2018-06-05: 23:00:00 via INTRAVENOUS
  Administered 2018-06-07: 75 mL/h via INTRAVENOUS

## 2018-06-04 MED ORDER — ACETAMINOPHEN 325 MG PO TABS: 650.0000 mg | ORAL_TABLET | Freq: Four times a day (QID) | ORAL | Status: DC | PRN

## 2018-06-04 MED ORDER — ACETAMINOPHEN 650 MG RE SUPP: 650.0000 mg | Freq: Four times a day (QID) | RECTAL | Status: DC | PRN

## 2018-06-04 MED ORDER — METHOCARBAMOL 500 MG PO TABS
500.0000 mg | ORAL_TABLET | Freq: Four times a day (QID) | ORAL | Status: DC | PRN
  Administered 2018-06-05 (×3): 500 mg via ORAL
  Filled 2018-06-04 (×3): qty 1

## 2018-06-04 NOTE — ED Notes (Signed)
Patient transported to CT 

## 2018-06-04 NOTE — H&P (Signed)
Gavin Khan is an 42 y.o. male.  HPI: the patient is a 42 yo male with history of cervical fusion who fell hard onto his back and hit his head.  He had transient numbness and inability to move his arms and legs for about 1 min and then his legs and arms recovered except for numbness in his finger tips left greater than right.  He was evaluated in the ER with MRI and CT and found to have severe stenosis at multiple levels and no fracture or ligamentous injuries.  He has a spinal cord contusion. Currently he c/o numbness in finger tips only.   Past Medical History:  Diagnosis Date  . Hypertension   . Medical history non-contributory     Past Surgical History:  Procedure Laterality Date  . ANTERIOR CERVICAL DECOMP/DISCECTOMY FUSION N/A 06/04/2015   Procedure: ANTERIOR CERVICAL DECOMPRESSION/DISCECTOMY FUSION 3 LEVELS REMOVAL OF INSTRUMENTATION;  Surgeon: Phylliss Bob, MD;  Location: [facility];  Service: Orthopedics;  Laterality: N/A;  Anterior cervical decompression fusion, cervical 4-5, cervical 6-7, cervical 7-thoracic 1 with instrumentation and allograft; Removal of instrumentation C5-6.  Marland Kitchen APPENDECTOMY    . BACK SURGERY    . LUMBAR LAMINECTOMY/DECOMPRESSION MICRODISCECTOMY Left 05/25/2017   Procedure: Lumbar three- four LUMBAR LAMINECTOMY/DECOMPRESSION MICRODISCECTOMY 1 LEVEL;  Surgeon: Phylliss Bob, MD;  Location: [facility];  Service: Orthopedics;  Laterality: Left;  . NECK SURGERY      No family history on file.  Social History:  reports that he has never smoked. He has never used smokeless tobacco. He reports that he drinks alcohol. He reports that he does not use drugs.  Allergies: No Known Allergies  Medications: I have reviewed the patient's current medications.  Results for orders placed or performed during the hospital encounter of 06/04/18 (from the past 48 hour(s))  CBC     Status: None   Collection Time: 06/04/18  2:04 PM  Result Value Ref Range   WBC 8.1 4.0 - 10.5 K/uL    RBC 4.67 4.22 - 5.81 MIL/uL   Hemoglobin 13.2 13.0 - 17.0 g/dL   HCT 39.1 39.0 - 52.0 %   MCV 83.7 78.0 - 100.0 fL   MCH 28.3 26.0 - 34.0 pg   MCHC 33.8 30.0 - 36.0 g/dL   RDW 14.3 11.5 - 15.5 %   Platelets 234 150 - 400 K/uL    Comment: Performed at [facility], [place] [address] [place], [place] [address]  Basic metabolic panel     Status: Abnormal   Collection Time: 06/04/18  2:04 PM  Result Value Ref Range   Sodium 142 135 - 145 mmol/L   Potassium 4.1 3.5 - 5.1 mmol/L   Chloride 106 101 - 111 mmol/L   CO2 29 22 - 32 mmol/L   Glucose, Bld 116 (H) 65 - 99 mg/dL   BUN 14 6 - 20 mg/dL   Creatinine, Ser 1.18 0.61 - 1.24 mg/dL   Calcium 9.6 8.9 - 10.3 mg/dL   GFR calc non Af Amer >60 >60 mL/min   GFR calc Af Amer >60 >60 mL/min    Comment: (NOTE) The eGFR has been calculated using the CKD EPI equation. This calculation has not been validated in all clinical situations. eGFR's persistently <60 mL/min signify possible Chronic Kidney Disease.    Anion gap 7 5 - 15    Comment: Performed at [facility] [address], [place], [place] [address]    Ct Head Wo Contrast  Result  Date: 06/04/2018 CLINICAL DATA:  42 y/o M; fall backward hitting between shoulder blades and back of head. Immediate transient numbness in all 4 extremities after fall. Persistent tingling in the bilateral fingers. History of C3-C6 spinal fusion. EXAM: CT HEAD WITHOUT CONTRAST CT CERVICAL SPINE WITHOUT CONTRAST CT THORACIC SPINE WITHOUT CONTRAST TECHNIQUE: Multidetector CT imaging of the head and cervical spine was performed following the standard protocol without intravenous contrast. Multiplanar CT image reconstructions of the cervical spine were also generated. Multidetector CT images of the thoracic were obtained using the standard protocol without intravenous contrast. COMPARISON:  None. FINDINGS: CT HEAD FINDINGS Brain: No evidence of acute infarction, hemorrhage, hydrocephalus, extra-axial  collection or mass lesion/mass effect. Vascular: No hyperdense vessel or unexpected calcification. Skull: Normal. Negative for fracture or focal lesion. Sinuses/Orbits: Right mastoid tip opacification. Normal aeration of left mastoid air cells and paranasal sinuses. Orbits are unremarkable. Other: None. CT CERVICAL SPINE FINDINGS Alignment: Normal. Skull base and vertebrae: No acute fracture. C4-T1 anterior cervical discectomy and fusion. Hardware is intact and there is no periprosthetic lucency or fracture. Soft tissues and spinal canal: No prevertebral fluid or swelling. No visible canal hematoma. Disc levels: C2-3: Left central disc protrusion with anterior cord impingement. C3-4: Left subarticular disc protrusion with left anterior cord impingement and probable contact on exiting left C4 nerve roots. C4-5: Posterior ossific ridging with a large central osteophyte impinging and possibly compressing the cord with severe canal stenosis. C5-6: Posterior ossific ridging with moderate canal stenosis. C6-7: Posterior ossific ridging with mild canal stenosis. C7-T1: No significant disc displacement, foraminal stenosis, or canal stenosis. Upper chest: Negative. Other: Postsurgical changes from the left anterior neck. CT THORACIC SPINE FINDINGS Alignment: Normal. Skull base and vertebrae: No acute fracture. No primary bone lesion or focal pathologic process. Soft tissues and spinal canal: No prevertebral fluid or swelling. No visible canal hematoma. Disc levels: Multilevel discogenic degenerative changes with small disc protrusions at the right subarticular T4-5, right subarticular T6-7, right subarticular T8-9 levels. There is calcified ligamentum flavum hypertrophy at T2-3. No high-grade bony canal stenosis. Upper chest: Negative. Other: Negative. IMPRESSION: CT head: No acute intracranial abnormality or calvarial fracture. Unremarkable CT of the head. CT cervical spine: 1. No acute fracture or malalignment. C4-T1 ACDF  without apparent hardware related complication. 2. Multilevel degenerative changes greatest at the C4-5 level where there is severe canal stenosis. Given patient symptoms in the setting of trauma, there may be cord contusion at this level. The cervical cord further assessed with cervical MRI as clinically indicated. CT thoracic spine: 1. No acute fracture or dislocation. 2. Mild discogenic degenerative changes of thoracic spine. These results were called by telephone at the time of interpretation on 06/04/2018 at 3:12 pm to Dr. Fredia Sorrow , who verbally acknowledged these results. Electronically Signed   By: Kristine Garbe M.D.   On: 06/04/2018 15:18   Ct Cervical Spine Wo Contrast  Result Date: 06/04/2018 CLINICAL DATA:  42 y/o M; fall backward hitting between shoulder blades and back of head. Immediate transient numbness in all 4 extremities after fall. Persistent tingling in the bilateral fingers. History of C3-C6 spinal fusion. EXAM: CT HEAD WITHOUT CONTRAST CT CERVICAL SPINE WITHOUT CONTRAST CT THORACIC SPINE WITHOUT CONTRAST TECHNIQUE: Multidetector CT imaging of the head and cervical spine was performed following the standard protocol without intravenous contrast. Multiplanar CT image reconstructions of the cervical spine were also generated. Multidetector CT images of the thoracic were obtained using the standard protocol without intravenous contrast. COMPARISON:  None. FINDINGS: CT HEAD FINDINGS Brain: No evidence of acute infarction, hemorrhage, hydrocephalus, extra-axial collection or mass lesion/mass effect. Vascular: No hyperdense vessel or unexpected calcification. Skull: Normal. Negative for fracture or focal lesion. Sinuses/Orbits: Right mastoid tip opacification. Normal aeration of left mastoid air cells and paranasal sinuses. Orbits are unremarkable. Other: None. CT CERVICAL SPINE FINDINGS Alignment: Normal. Skull base and vertebrae: No acute fracture. C4-T1 anterior cervical  discectomy and fusion. Hardware is intact and there is no periprosthetic lucency or fracture. Soft tissues and spinal canal: No prevertebral fluid or swelling. No visible canal hematoma. Disc levels: C2-3: Left central disc protrusion with anterior cord impingement. C3-4: Left subarticular disc protrusion with left anterior cord impingement and probable contact on exiting left C4 nerve roots. C4-5: Posterior ossific ridging with a large central osteophyte impinging and possibly compressing the cord with severe canal stenosis. C5-6: Posterior ossific ridging with moderate canal stenosis. C6-7: Posterior ossific ridging with mild canal stenosis. C7-T1: No significant disc displacement, foraminal stenosis, or canal stenosis. Upper chest: Negative. Other: Postsurgical changes from the left anterior neck. CT THORACIC SPINE FINDINGS Alignment: Normal. Skull base and vertebrae: No acute fracture. No primary bone lesion or focal pathologic process. Soft tissues and spinal canal: No prevertebral fluid or swelling. No visible canal hematoma. Disc levels: Multilevel discogenic degenerative changes with small disc protrusions at the right subarticular T4-5, right subarticular T6-7, right subarticular T8-9 levels. There is calcified ligamentum flavum hypertrophy at T2-3. No high-grade bony canal stenosis. Upper chest: Negative. Other: Negative. IMPRESSION: CT head: No acute intracranial abnormality or calvarial fracture. Unremarkable CT of the head. CT cervical spine: 1. No acute fracture or malalignment. C4-T1 ACDF without apparent hardware related complication. 2. Multilevel degenerative changes greatest at the C4-5 level where there is severe canal stenosis. Given patient symptoms in the setting of trauma, there may be cord contusion at this level. The cervical cord further assessed with cervical MRI as clinically indicated. CT thoracic spine: 1. No acute fracture or dislocation. 2. Mild discogenic degenerative changes of  thoracic spine. These results were called by telephone at the time of interpretation on 06/04/2018 at 3:12 pm to Dr. Fredia Sorrow , who verbally acknowledged these results. Electronically Signed   By: Kristine Garbe M.D.   On: 06/04/2018 15:18   Ct Thoracic Spine Wo Contrast  Result Date: 06/04/2018 CLINICAL DATA:  42 y/o M; fall backward hitting between shoulder blades and back of head. Immediate transient numbness in all 4 extremities after fall. Persistent tingling in the bilateral fingers. History of C3-C6 spinal fusion. EXAM: CT HEAD WITHOUT CONTRAST CT CERVICAL SPINE WITHOUT CONTRAST CT THORACIC SPINE WITHOUT CONTRAST TECHNIQUE: Multidetector CT imaging of the head and cervical spine was performed following the standard protocol without intravenous contrast. Multiplanar CT image reconstructions of the cervical spine were also generated. Multidetector CT images of the thoracic were obtained using the standard protocol without intravenous contrast. COMPARISON:  None. FINDINGS: CT HEAD FINDINGS Brain: No evidence of acute infarction, hemorrhage, hydrocephalus, extra-axial collection or mass lesion/mass effect. Vascular: No hyperdense vessel or unexpected calcification. Skull: Normal. Negative for fracture or focal lesion. Sinuses/Orbits: Right mastoid tip opacification. Normal aeration of left mastoid air cells and paranasal sinuses. Orbits are unremarkable. Other: None. CT CERVICAL SPINE FINDINGS Alignment: Normal. Skull base and vertebrae: No acute fracture. C4-T1 anterior cervical discectomy and fusion. Hardware is intact and there is no periprosthetic lucency or fracture. Soft tissues and spinal canal: No prevertebral fluid or swelling. No visible canal hematoma. Disc levels:  C2-3: Left central disc protrusion with anterior cord impingement. C3-4: Left subarticular disc protrusion with left anterior cord impingement and probable contact on exiting left C4 nerve roots. C4-5: Posterior ossific  ridging with a large central osteophyte impinging and possibly compressing the cord with severe canal stenosis. C5-6: Posterior ossific ridging with moderate canal stenosis. C6-7: Posterior ossific ridging with mild canal stenosis. C7-T1: No significant disc displacement, foraminal stenosis, or canal stenosis. Upper chest: Negative. Other: Postsurgical changes from the left anterior neck. CT THORACIC SPINE FINDINGS Alignment: Normal. Skull base and vertebrae: No acute fracture. No primary bone lesion or focal pathologic process. Soft tissues and spinal canal: No prevertebral fluid or swelling. No visible canal hematoma. Disc levels: Multilevel discogenic degenerative changes with small disc protrusions at the right subarticular T4-5, right subarticular T6-7, right subarticular T8-9 levels. There is calcified ligamentum flavum hypertrophy at T2-3. No high-grade bony canal stenosis. Upper chest: Negative. Other: Negative. IMPRESSION: CT head: No acute intracranial abnormality or calvarial fracture. Unremarkable CT of the head. CT cervical spine: 1. No acute fracture or malalignment. C4-T1 ACDF without apparent hardware related complication. 2. Multilevel degenerative changes greatest at the C4-5 level where there is severe canal stenosis. Given patient symptoms in the setting of trauma, there may be cord contusion at this level. The cervical cord further assessed with cervical MRI as clinically indicated. CT thoracic spine: 1. No acute fracture or dislocation. 2. Mild discogenic degenerative changes of thoracic spine. These results were called by telephone at the time of interpretation on 06/04/2018 at 3:12 pm to Dr. Fredia Sorrow , who verbally acknowledged these results. Electronically Signed   By: Kristine Garbe M.D.   On: 06/04/2018 15:18   Mr Cervical Spine Wo Contrast  Result Date: 06/04/2018 CLINICAL DATA:  42 y/o M; fall with transient paralysis in all extremities and persistent tingling in the  fingers and upper extremities. EXAM: MRI CERVICAL SPINE WITHOUT CONTRAST TECHNIQUE: Multiplanar, multisequence MR imaging of the cervical spine was performed. No intravenous contrast was administered. COMPARISON:  06/04/2018 CT of the cervical spine. FINDINGS: Alignment: Physiologic. Vertebrae: C4-T1 anterior cervical discectomy and fusion. Susceptibility artifact from fusion hardware partially obscures the vertebral bodies at those levels. No evidence for spinal fracture or ligamentous injury. No soft tissue edema. Cord: Mild edema within the central cord at the C3-4 level without cord volume loss. Increased signal within the cervical cord at the C4-5 and C5-6 levels with loss of cord volume. Posterior Fossa, vertebral arteries, paraspinal tissues: Negative. Disc levels: Congenital cervical spinal canal stenosis. C2-3: Disc osteophyte complex with left-greater-than-right uncovertebral and facet hypertrophy. Mild left foraminal stenosis and mild canal stenosis. Small left central disc protrusion with ventral thecal sac effacement and left anterior cord contact. C3-4: Disc osteophyte complex and bilateral uncovertebral/facet hypertrophy with moderate right and severe left foraminal stenosis and moderate to severe canal stenosis. C4-5: Discectomy, prominent posterior central endplate osteophytes, and bilateral uncovertebral/facet hypertrophy. Moderate bilateral foraminal stenosis. Severe canal stenosis. C5-6: Discectomy and fusion. Mild canal stenosis. No significant foraminal stenosis. C6-7: Discectomy and fusion. Posterior ossific ridging. Bilateral uncovertebral and facet hypertrophy. Mild foraminal and canal stenosis. C7-T1: Discectomy and fusion. No significant foraminal or canal stenosis. T1-2: Left-sided uncovertebral and facet hypertrophy with mild left foraminal stenosis. IMPRESSION: 1. Mild C3-4 cord edema without cord volume loss. Moderate to severe canal stenosis at this level. Cord edema probably  represents a cord contusion. No associate cord hemorrhage. 2. 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 3. No acute fracture or ligamentous injury. 4. Congenital cervical canal stenosis, C4-T1 ACDF, and multilevel cervical spondylosis. 5. Moderate to severe C3-4 and severe C4-5 canal stenosis. Multilevel mild canal stenosis. 6. Severe left C3-4 foraminal stenosis. Multilevel mild and moderate foraminal stenosis. These results were called by telephone at the time of interpretation on 06/04/2018 at 6:35 pm to Dr. Fredia Sorrow , who verbally acknowledged these results. Electronically Signed   By: Kristine Garbe M.D.   On: 06/04/2018 18:39    ROS  ROS: I have reviewed the patient's review of systems thoroughly and there are no positive responses as relates to the HPI. Blood pressure 131/71, pulse 97, temperature 98.1 F (36.7 C), temperature source Oral, resp. rate (!) 22, height 6' 2"  (1.88 m), weight 112.5 kg (248 lb), SpO2 99 %. Physical Exam Well-developed well-nourished patient in no acute distress. Alert and oriented x3 HEENT:within normal limits Cardiac: Regular rate and rhythm Pulmonary: Lungs clear to auscultation Abdomen: Soft and nontender.  Normal active bowel sounds  Musculoskeletal: upper ext subtle weakness r tricepts and bicepts reflexes normal in tricepts bilat and weeak in bicepts and brachio rad. Lowweer ext mild hyperreflexia and 3-4 beet clonus bilat.  Neg babinski  Assessment/Plan: 42 yo male with transient paralysis which resolved quickly in setting of prev cerv spine fusion currently with severe stenosis of upper cerv levels and spinal; cord contusion.// will need admission and observation.  Will start steroids.  Dr Lynann Bologna is aware of patient and will see tomorrow morning and take over care.  Alta Corning 06/04/2018, 9:21 PM

## 2018-06-04 NOTE — Progress Notes (Signed)
Pt arrived to [address] via wheelchair. Pt was able to walk to bed. Pain 3/10 refuses pain meds at this time. Pt is in stable condition wife at bedside. Call light within reach. MD is aware pt is on floor.

## 2018-06-04 NOTE — ED Provider Notes (Signed)
[facility] Provider Note   CSN: [id number] Arrival date & time: 06/04/18  1316     History   Chief Complaint Chief Complaint  Patient presents with  . Fall    HPI Gavin LandsmanBobby Khan is a 42 y.o. male.  Patient brought in by EMS.  [place]  Slipped on the steps fell backwards striking the back of his head and the upper part of his back between his shoulder blades.  Patient with 1 minute of complete paralysis.  Unable to move.  But could brief.  Leg function started to come back after that hand function came back after that had persistent numbness and tingling in the fingertips left arm greater than right arm.  Patient right-hand-dominant.  Patient status post anterior approach cervical fusion C4-5-6 7 in the past.  Believe that was done in 2017 by Dr. Yevette Edwardsumonski.  From [facility].  Patient without any loss of consciousness.  Patient arrived on spine board cervical collar and cervical blocks.  Patient denied any other injuries.  Pain to neck pain to upper thoracic back.  No incontinence.     Past Medical History:  Diagnosis Date  . Hypertension   . Medical history non-contributory     Patient Active Problem List   Diagnosis Date Noted  . Myeloradiculopathy 06/04/2015    Past Surgical History:  Procedure Laterality Date  . ANTERIOR CERVICAL DECOMP/DISCECTOMY FUSION N/A 06/04/2015   Procedure: ANTERIOR CERVICAL DECOMPRESSION/DISCECTOMY FUSION 3 LEVELS REMOVAL OF INSTRUMENTATION;  Surgeon: Estill BambergMark Dumonski, MD;  Location: [facility] OR;  Service: Orthopedics;  Laterality: N/A;  Anterior cervical decompression fusion, cervical 4-5, cervical 6-7, cervical 7-thoracic 1 with instrumentation and allograft; Removal of instrumentation C5-6.  Marland Kitchen. APPENDECTOMY    . BACK SURGERY    . LUMBAR LAMINECTOMY/DECOMPRESSION MICRODISCECTOMY Left 05/25/2017   Procedure: Lumbar three- four LUMBAR LAMINECTOMY/DECOMPRESSION MICRODISCECTOMY 1 LEVEL;   Surgeon: Estill Bambergumonski, Mark, MD;  Location: [facility] OR;  Service: Orthopedics;  Laterality: Left;  . NECK SURGERY          Home Medications    Prior to Admission medications   Medication Sig Start Date End Date Taking? Authorizing Provider  allopurinol (ZYLOPRIM) 100 MG tablet Take 100 mg by mouth every other day. 06/04/18  Yes [provider]  Ergocalciferol (VITAMIN D2) 2000 UNITS TABS Take 2,000 Units by mouth daily.   Yes [provider]  lisinopril-hydrochlorothiazide (PRINZIDE,ZESTORETIC) 10-12.5 MG per tablet Take 1 tablet by mouth daily.   Yes [provider]  naproxen sodium (ALEVE) 220 MG tablet Take 220 mg by mouth as needed.   Yes [provider]  lidocaine (LIDODERM) 5 % Place 1 patch onto the skin daily. Remove & Discard patch within 12 hours or as directed by MD Patient not taking: Reported on 06/04/2018 05/10/17   Anselm PancoastJoy, Shawn C, PA-C    Family History No family history on file.  Social History Social History   Tobacco Use  . Smoking status: Never Smoker  . Smokeless tobacco: Never Used  Substance Use Topics  . Alcohol use: Yes    Comment: casual  . Drug use: No     Allergies   Patient has no known allergies.   Review of Systems Review of Systems  Constitutional: Negative for fever.  HENT: Negative for congestion.   Eyes: Negative for visual disturbance.  Respiratory: Negative for shortness of breath.   Cardiovascular: Negative for chest pain.  Gastrointestinal: Negative for abdominal pain.  Genitourinary: Negative  for difficulty urinating.  Musculoskeletal: Positive for back pain and neck pain.  Skin: Negative for wound.  Neurological: Positive for weakness and numbness. Negative for headaches.  Hematological: Does not bruise/bleed easily.  Psychiatric/Behavioral: Negative for confusion.     Physical Exam Updated Vital Signs BP 127/89   Pulse 94   Temp 98.1 F (36.7 C) (Oral)   Resp 19   Ht 1.88 m (6\' 2" )   Wt  112.5 kg (248 lb)   SpO2 98%   BMI 31.84 kg/m   Physical Exam  Constitutional: He is oriented to person, place, and time. He appears well-developed and well-nourished. No distress.  HENT:  Head: Normocephalic and atraumatic.  Mouth/Throat: Oropharynx is clear and moist.  Eyes: Pupils are equal, round, and reactive to light. Conjunctivae and EOM are normal.  Neck:   Cervical collar in place.  Cardiovascular: Normal rate, regular rhythm and normal heart sounds.  Pulmonary/Chest: Effort normal and breath sounds normal. No respiratory distress.  Abdominal: Soft. Bowel sounds are normal. There is no tenderness.  Musculoskeletal: Normal range of motion. He exhibits no deformity.  When he was rolled with cervical collar on palpation of the thoracic and lumbar spine without any point tenderness.  No evidence of any wounds or injuries.  Neurological: He is alert and oriented to person, place, and time. A sensory deficit is present. No cranial nerve deficit. He exhibits normal muscle tone. Coordination normal.  Skin: Skin is warm.  Nursing note and vitals reviewed.    ED Treatments / Results  Labs (all labs ordered are listed, but only abnormal results are displayed) Labs Reviewed  BASIC METABOLIC PANEL - Abnormal; Notable for the following components:      Result Value   Glucose, Bld 116 (*)    All other components within normal limits  CBC    EKG EKG Interpretation  Date/Time:  Sunday June 04 2018 13:59:07 EDT Ventricular Rate:  79 PR Interval:    QRS Duration: 95 QT Interval:  351 QTC Calculation: 403 R Axis:   68 Text Interpretation:  Sinus rhythm Confirmed by Vanetta Mulders [phone] on 06/04/2018 2:07:50 PM   Radiology Ct Head Wo Contrast  Result Date: 06/04/2018 CLINICAL DATA:  42 y/o M; fall backward hitting between shoulder blades and back of head. Immediate transient numbness in all 4 extremities after fall. Persistent tingling in the bilateral fingers. History of  C3-C6 spinal fusion. EXAM: CT HEAD WITHOUT CONTRAST CT CERVICAL SPINE WITHOUT CONTRAST CT THORACIC SPINE WITHOUT CONTRAST TECHNIQUE: Multidetector CT imaging of the head and cervical spine was performed following the standard protocol without intravenous contrast. Multiplanar CT image reconstructions of the cervical spine were also generated. Multidetector CT images of the thoracic were obtained using the standard protocol without intravenous contrast. COMPARISON:  None. FINDINGS: CT HEAD FINDINGS Brain: No evidence of acute infarction, hemorrhage, hydrocephalus, extra-axial collection or mass lesion/mass effect. Vascular: No hyperdense vessel or unexpected calcification. Skull: Normal. Negative for fracture or focal lesion. Sinuses/Orbits: Right mastoid tip opacification. Normal aeration of left mastoid air cells and paranasal sinuses. Orbits are unremarkable. Other: None. CT CERVICAL SPINE FINDINGS Alignment: Normal. Skull base and vertebrae: No acute fracture. C4-T1 anterior cervical discectomy and fusion. Hardware is intact and there is no periprosthetic lucency or fracture. Soft tissues and spinal canal: No prevertebral fluid or swelling. No visible canal hematoma. Disc levels: C2-3: Left central disc protrusion with anterior cord impingement. C3-4: Left subarticular disc protrusion with left anterior cord impingement and probable contact on exiting  left C4 nerve roots. C4-5: Posterior ossific ridging with a large central osteophyte impinging and possibly compressing the cord with severe canal stenosis. C5-6: Posterior ossific ridging with moderate canal stenosis. C6-7: Posterior ossific ridging with mild canal stenosis. C7-T1: No significant disc displacement, foraminal stenosis, or canal stenosis. Upper chest: Negative. Other: Postsurgical changes from the left anterior neck. CT THORACIC SPINE FINDINGS Alignment: Normal. Skull base and vertebrae: No acute fracture. No primary bone lesion or focal pathologic  process. Soft tissues and spinal canal: No prevertebral fluid or swelling. No visible canal hematoma. Disc levels: Multilevel discogenic degenerative changes with small disc protrusions at the right subarticular T4-5, right subarticular T6-7, right subarticular T8-9 levels. There is calcified ligamentum flavum hypertrophy at T2-3. No high-grade bony canal stenosis. Upper chest: Negative. Other: Negative. IMPRESSION: CT head: No acute intracranial abnormality or calvarial fracture. Unremarkable CT of the head. CT cervical spine: 1. No acute fracture or malalignment. C4-T1 ACDF without apparent hardware related complication. 2. Multilevel degenerative changes greatest at the C4-5 level where there is severe canal stenosis. Given patient symptoms in the setting of trauma, there may be cord contusion at this level. The cervical cord further assessed with cervical MRI as clinically indicated. CT thoracic spine: 1. No acute fracture or dislocation. 2. Mild discogenic degenerative changes of thoracic spine. These results were called by telephone at the time of interpretation on 06/04/2018 at 3:12 pm to Dr. Vanetta Mulders , who verbally acknowledged these results. Electronically Signed   By: Mitzi Hansen M.D.   On: 06/04/2018 15:18   Ct Cervical Spine Wo Contrast  Result Date: 06/04/2018 CLINICAL DATA:  42 y/o M; fall backward hitting between shoulder blades and back of head. Immediate transient numbness in all 4 extremities after fall. Persistent tingling in the bilateral fingers. History of C3-C6 spinal fusion. EXAM: CT HEAD WITHOUT CONTRAST CT CERVICAL SPINE WITHOUT CONTRAST CT THORACIC SPINE WITHOUT CONTRAST TECHNIQUE: Multidetector CT imaging of the head and cervical spine was performed following the standard protocol without intravenous contrast. Multiplanar CT image reconstructions of the cervical spine were also generated. Multidetector CT images of the thoracic were obtained using the standard  protocol without intravenous contrast. COMPARISON:  None. FINDINGS: CT HEAD FINDINGS Brain: No evidence of acute infarction, hemorrhage, hydrocephalus, extra-axial collection or mass lesion/mass effect. Vascular: No hyperdense vessel or unexpected calcification. Skull: Normal. Negative for fracture or focal lesion. Sinuses/Orbits: Right mastoid tip opacification. Normal aeration of left mastoid air cells and paranasal sinuses. Orbits are unremarkable. Other: None. CT CERVICAL SPINE FINDINGS Alignment: Normal. Skull base and vertebrae: No acute fracture. C4-T1 anterior cervical discectomy and fusion. Hardware is intact and there is no periprosthetic lucency or fracture. Soft tissues and spinal canal: No prevertebral fluid or swelling. No visible canal hematoma. Disc levels: C2-3: Left central disc protrusion with anterior cord impingement. C3-4: Left subarticular disc protrusion with left anterior cord impingement and probable contact on exiting left C4 nerve roots. C4-5: Posterior ossific ridging with a large central osteophyte impinging and possibly compressing the cord with severe canal stenosis. C5-6: Posterior ossific ridging with moderate canal stenosis. C6-7: Posterior ossific ridging with mild canal stenosis. C7-T1: No significant disc displacement, foraminal stenosis, or canal stenosis. Upper chest: Negative. Other: Postsurgical changes from the left anterior neck. CT THORACIC SPINE FINDINGS Alignment: Normal. Skull base and vertebrae: No acute fracture. No primary bone lesion or focal pathologic process. Soft tissues and spinal canal: No prevertebral fluid or swelling. No visible canal hematoma. Disc levels: Multilevel discogenic degenerative  changes with small disc protrusions at the right subarticular T4-5, right subarticular T6-7, right subarticular T8-9 levels. There is calcified ligamentum flavum hypertrophy at T2-3. No high-grade bony canal stenosis. Upper chest: Negative. Other: Negative. IMPRESSION:  CT head: No acute intracranial abnormality or calvarial fracture. Unremarkable CT of the head. CT cervical spine: 1. No acute fracture or malalignment. C4-T1 ACDF without apparent hardware related complication. 2. Multilevel degenerative changes greatest at the C4-5 level where there is severe canal stenosis. Given patient symptoms in the setting of trauma, there may be cord contusion at this level. The cervical cord further assessed with cervical MRI as clinically indicated. CT thoracic spine: 1. No acute fracture or dislocation. 2. Mild discogenic degenerative changes of thoracic spine. These results were called by telephone at the time of interpretation on 06/04/2018 at 3:12 pm to Dr. Vanetta Mulders , who verbally acknowledged these results. Electronically Signed   By: Mitzi Hansen M.D.   On: 06/04/2018 15:18   Ct Thoracic Spine Wo Contrast  Result Date: 06/04/2018 CLINICAL DATA:  42 y/o M; fall backward hitting between shoulder blades and back of head. Immediate transient numbness in all 4 extremities after fall. Persistent tingling in the bilateral fingers. History of C3-C6 spinal fusion. EXAM: CT HEAD WITHOUT CONTRAST CT CERVICAL SPINE WITHOUT CONTRAST CT THORACIC SPINE WITHOUT CONTRAST TECHNIQUE: Multidetector CT imaging of the head and cervical spine was performed following the standard protocol without intravenous contrast. Multiplanar CT image reconstructions of the cervical spine were also generated. Multidetector CT images of the thoracic were obtained using the standard protocol without intravenous contrast. COMPARISON:  None. FINDINGS: CT HEAD FINDINGS Brain: No evidence of acute infarction, hemorrhage, hydrocephalus, extra-axial collection or mass lesion/mass effect. Vascular: No hyperdense vessel or unexpected calcification. Skull: Normal. Negative for fracture or focal lesion. Sinuses/Orbits: Right mastoid tip opacification. Normal aeration of left mastoid air cells and paranasal  sinuses. Orbits are unremarkable. Other: None. CT CERVICAL SPINE FINDINGS Alignment: Normal. Skull base and vertebrae: No acute fracture. C4-T1 anterior cervical discectomy and fusion. Hardware is intact and there is no periprosthetic lucency or fracture. Soft tissues and spinal canal: No prevertebral fluid or swelling. No visible canal hematoma. Disc levels: C2-3: Left central disc protrusion with anterior cord impingement. C3-4: Left subarticular disc protrusion with left anterior cord impingement and probable contact on exiting left C4 nerve roots. C4-5: Posterior ossific ridging with a large central osteophyte impinging and possibly compressing the cord with severe canal stenosis. C5-6: Posterior ossific ridging with moderate canal stenosis. C6-7: Posterior ossific ridging with mild canal stenosis. C7-T1: No significant disc displacement, foraminal stenosis, or canal stenosis. Upper chest: Negative. Other: Postsurgical changes from the left anterior neck. CT THORACIC SPINE FINDINGS Alignment: Normal. Skull base and vertebrae: No acute fracture. No primary bone lesion or focal pathologic process. Soft tissues and spinal canal: No prevertebral fluid or swelling. No visible canal hematoma. Disc levels: Multilevel discogenic degenerative changes with small disc protrusions at the right subarticular T4-5, right subarticular T6-7, right subarticular T8-9 levels. There is calcified ligamentum flavum hypertrophy at T2-3. No high-grade bony canal stenosis. Upper chest: Negative. Other: Negative. IMPRESSION: CT head: No acute intracranial abnormality or calvarial fracture. Unremarkable CT of the head. CT cervical spine: 1. No acute fracture or malalignment. C4-T1 ACDF without apparent hardware related complication. 2. Multilevel degenerative changes greatest at the C4-5 level where there is severe canal stenosis. Given patient symptoms in the setting of trauma, there may be cord contusion at this level. The cervical cord  further assessed with cervical MRI as clinically indicated. CT thoracic spine: 1. No acute fracture or dislocation. 2. Mild discogenic degenerative changes of thoracic spine. These results were called by telephone at the time of interpretation on 06/04/2018 at 3:12 pm to Dr. Vanetta Mulders , who verbally acknowledged these results. Electronically Signed   By: Mitzi Hansen M.D.   On: 06/04/2018 15:18   Mr Cervical Spine Wo Contrast  Result Date: 06/04/2018 CLINICAL DATA:  42 y/o M; fall with transient paralysis in all extremities and persistent tingling in the fingers and upper extremities. EXAM: MRI CERVICAL SPINE WITHOUT CONTRAST TECHNIQUE: Multiplanar, multisequence MR imaging of the cervical spine was performed. No intravenous contrast was administered. COMPARISON:  06/04/2018 CT of the cervical spine. FINDINGS: Alignment: Physiologic. Vertebrae: C4-T1 anterior cervical discectomy and fusion. Susceptibility artifact from fusion hardware partially obscures the vertebral bodies at those levels. No evidence for spinal fracture or ligamentous injury. No soft tissue edema. Cord: Mild edema within the central cord at the C3-4 level without cord volume loss. Increased signal within the cervical cord at the C4-5 and C5-6 levels with loss of cord volume. Posterior Fossa, vertebral arteries, paraspinal tissues: Negative. Disc levels: Congenital cervical spinal canal stenosis. C2-3: Disc osteophyte complex with left-greater-than-right uncovertebral and facet hypertrophy. Mild left foraminal stenosis and mild canal stenosis. Small left central disc protrusion with ventral thecal sac effacement and left anterior cord contact. C3-4: Disc osteophyte complex and bilateral uncovertebral/facet hypertrophy with moderate right and severe left foraminal stenosis and moderate to severe canal stenosis. C4-5: Discectomy, prominent posterior central endplate osteophytes, and bilateral uncovertebral/facet hypertrophy.  Moderate bilateral foraminal stenosis. Severe canal stenosis. C5-6: Discectomy and fusion. Mild canal stenosis. No significant foraminal stenosis. C6-7: Discectomy and fusion. Posterior ossific ridging. Bilateral uncovertebral and facet hypertrophy. Mild foraminal and canal stenosis. C7-T1: Discectomy and fusion. No significant foraminal or canal stenosis. T1-2: Left-sided uncovertebral and facet hypertrophy with mild left foraminal stenosis. IMPRESSION: 1. Mild C3-4 cord edema without cord volume loss. Moderate to severe canal stenosis at this level. Cord edema probably represents a cord contusion. No associate cord hemorrhage. 2. 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 3. No acute fracture or ligamentous injury. 4. Congenital cervical canal stenosis, C4-T1 ACDF, and multilevel cervical spondylosis. 5. Moderate to severe C3-4 and severe C4-5 canal stenosis. Multilevel mild canal stenosis. 6. Severe left C3-4 foraminal stenosis. Multilevel mild and moderate foraminal stenosis. These results were called by telephone at the time of interpretation on 06/04/2018 at 6:35 pm to Dr. Vanetta Mulders , who verbally acknowledged these results. Electronically Signed   By: Mitzi Hansen M.D.   On: 06/04/2018 18:39    Procedures Procedures (including critical care time)  CRITICAL CARE Performed by: Vanetta Mulders Total critical care time: 30 minutes Critical care time was exclusive of separately billable procedures and treating other patients. Critical care was necessary to treat or prevent imminent or life-threatening deterioration. Critical care was time spent personally by me on the following activities: development of treatment plan with patient and/or surrogate as well as nursing, discussions with consultants, evaluation of patient's response to treatment, examination of patient, obtaining history from patient or  surrogate, ordering and performing treatments and interventions, ordering and review of laboratory studies, ordering and review of radiographic studies, pulse oximetry and re-evaluation of patient's condition.    Medications Ordered in ED Medications  0.9 %  sodium chloride infusion ( Intravenous Rate/Dose Verify 06/04/18  1408)  LORazepam (ATIVAN) injection 2 mg (2 mg Intravenous Given 06/04/18 1634)     Initial Impression / Assessment and Plan / ED Course  I have reviewed the triage vital signs and the nursing notes.  Pertinent labs & imaging results that were available during my care of the patient were reviewed by me and considered in my medical decision making (see chart for details).    Patient's presentation consistent with a central cord injury.  Due to the nature fall.  With cervical collar in place patient underwent CT head neck and thoracic lumbar area without any acute bony injuries.  Hardware seem to be intact the patient had significant narrowing at C4-C5.  Based on this and the concern for central cord injury patient had MRI cervical spine which showed cord edema mild at C3-4 without cord volume loss moderate to severe canal stenosis at this level.  Patient had increased cord signal at C4-5 C5-6 levels with loss of cord volume compatible with a chronic compressive myelopathy not acute.  No evidence of any ligamental injury.  In addition as stated above CT scan showed no bony injuries.  Based on this neurosurgery was contacted.  But since he had been operated on by spine orthopedics in the past [facility] Dr. Yetta Barre recommended that we call them.  Dr. Skip Estimable was on-call for Ortho he discussed the situation with Dr. Yevette Edwards.  To admit.  Patient switched over to an Aspen collar.  Extensive recheck on neuro showed no bicep triceps weakness.  But had persistent numbness to the tips of his fingers again left hand greater than right.  Lower extremity motor strength seem to  be normal.  Patient given Decadron temporary admit orders done for orthopedics to a MedSurg bed.   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

## 2018-06-04 NOTE — ED Notes (Signed)
Pt ambulated to bathroom with no acute distress

## 2018-06-04 NOTE — ED Triage Notes (Signed)
[place] [facility] [place] [employer] [place] wave [place], slipped and fell. Reports no LOC. Pt stated he had paralysis in all extremities for about 30-60 seconds but then was able to move all extremities. Pt complains of a tingle in his fingers and upper extremities. Pt A&O

## 2018-06-04 NOTE — ED Notes (Signed)
Patient returned from CT

## 2018-06-04 NOTE — ED Notes (Signed)
Dr. Luiz BlareGraves, Ortho at bedside

## 2018-06-04 NOTE — ED Notes (Signed)
Patient transported to MRI 

## 2018-06-05 ENCOUNTER — Encounter (HOSPITAL_COMMUNITY): Payer: Self-pay | Admitting: General Practice

## 2018-06-05 ENCOUNTER — Other Ambulatory Visit: Payer: Self-pay | Admitting: Orthopedic Surgery

## 2018-06-05 ENCOUNTER — Other Ambulatory Visit: Payer: Self-pay

## 2018-06-05 NOTE — Progress Notes (Signed)
As documented by Dr. Luiz BlareGraves, patient had an injury occur yesterday, which resulted in quadriplegia for a minute or so. Patient is now ambulatory, with minimal pain, rated at 3/10. Pain in the neck is described as an aching sensation, and has improved.   BP 111/76 (BP Location: Right Arm)   Pulse 85   Temp 98.1 F (36.7 C) (Oral)   Resp 18   Ht 6\' 2"  (1.88 m)   Wt 248 lb (112.5 kg)   SpO2 96%   BMI 31.84 kg/m   Full strength on MMT at upper and lower extremities, with clonus noted in b/l LEs.  CT is negative fracture, notable for bony spurs at C4/5 with a solid fusion from C4-T1. 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   Plan currently is to observe patient and proceed with a posterior cervical decompression and fusion procedure, spanning C3-C6. I did discuss with patient the risks of infection, NV injury, spinal cord injury, ASD, and failure of instrumentation and nonunion. He does understand that not proceeding with surgery is an option, but does bring with it the risk of progressive neurological deficits, and he does with to proceed. Current plan is for the patient to consider his options for now. I will touch base with him later today and potentially proceed with surgery depending on his wishes.

## 2018-06-05 NOTE — Progress Notes (Signed)
Of note, since the patient's documented evaluation by me, I did have an additional discussion with his wife.  She has expressed that he would like to proceed with the surgery outlined in his last progress note.  The current plan is to proceed with surgery on Wednesday.  He will be made nothing by mouth after midnight Tuesday night, with surgery tentatively planned for Wednesday.

## 2018-06-05 NOTE — Plan of Care (Signed)
  Problem: Nutrition: Goal: Adequate nutrition will be maintained Outcome: Progressing   Problem: Elimination: Goal: Will not experience complications related to bowel motility Outcome: Progressing   Problem: Pain Managment: Goal: General experience of comfort will improve Outcome: Progressing   Problem: Safety: Goal: Ability to remain free from injury will improve Outcome: Progressing   

## 2018-06-06 LAB — SURGICAL PCR SCREEN
MRSA, PCR: NEGATIVE
Staphylococcus aureus: NEGATIVE

## 2018-06-06 NOTE — Progress Notes (Signed)
    Patient doing well today, ongoing N/T in BIL hands, has been wearing collar and akin PO's well. NL B/B function. He is pending surgery tomorrow and had several additional questions. He is in good spirits. Ongoing mild aching pain in neck 3/10.   Physical Exam: Vitals:   06/05/18 2100 06/06/18 0537  BP: 128/73 112/62  Pulse: 88 71  Resp: 17 16  Temp: 97.8 F (36.6 C) (!) 97.5 F (36.4 C)  SpO2: 98% 95%   Full strength on MMT at upper and lower extremities, with ongoing clonus noted in b/l LEs. + Hoffmans BIL  CT is negative fracture, notable for bony spurs at C4/5 with a solid fusion from C4-T1. 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   Plan currently is to proceed with a posterior cervical decompression and fusion procedure as discussed in detail yesterday. He does understand that not proceeding with surgery is an option, but does bring with it the risk of progressive neurological deficits.He does continue to wish to proceed.   NPO midnight

## 2018-06-06 NOTE — Progress Notes (Signed)
Received permission from MD for Pt's off unit privileges.

## 2018-06-06 NOTE — Plan of Care (Signed)
  Problem: Nutrition: Goal: Adequate nutrition will be maintained Outcome: Progressing   Problem: Elimination: Goal: Will not experience complications related to bowel motility Outcome: Progressing   Problem: Pain Managment: Goal: General experience of comfort will improve Outcome: Progressing   Problem: Safety: Goal: Ability to remain free from injury will improve Outcome: Progressing   

## 2018-06-07 ENCOUNTER — Ambulatory Visit (HOSPITAL_COMMUNITY)
Admission: EM | Disposition: A | Discharge status: Home / Self Care | Payer: Self-pay | Source: Home / Self Care | Attending: Emergency Medicine

## 2018-06-07 ENCOUNTER — Inpatient Hospital Stay (HOSPITAL_COMMUNITY): Payer: Managed Care, Other (non HMO) | Admitting: Anesthesiology

## 2018-06-07 ENCOUNTER — Inpatient Hospital Stay (HOSPITAL_COMMUNITY): Payer: Managed Care, Other (non HMO)

## 2018-06-07 DIAGNOSIS — M5410 Radiculopathy, site unspecified: Secondary | ICD-10-CM | POA: Diagnosis present

## 2018-06-07 HISTORY — PX: POSTERIOR CERVICAL FUSION/FORAMINOTOMY: SHX5038

## 2018-06-07 SURGERY — POSTERIOR CERVICAL FUSION/FORAMINOTOMY LEVEL 1
Anesthesia: General | Site: Spine Cervical

## 2018-06-07 MED ORDER — THROMBIN (RECOMBINANT) 20000 UNITS EX SOLR
CUTANEOUS | Status: AC
  Filled 2018-06-07: qty 20000

## 2018-06-07 MED ORDER — PROPOFOL 10 MG/ML IV BOLUS
INTRAVENOUS | Status: AC
  Filled 2018-06-07: qty 20

## 2018-06-07 MED ORDER — OXYCODONE-ACETAMINOPHEN 5-325 MG PO TABS
ORAL_TABLET | ORAL | Status: AC
  Administered 2018-06-07: 2 via ORAL
  Filled 2018-06-07: qty 2

## 2018-06-07 MED ORDER — SODIUM CHLORIDE 0.9 % IV SOLN: 250.0000 mL | INTRAVENOUS | Status: DC

## 2018-06-07 MED ORDER — BUPIVACAINE LIPOSOME 1.3 % IJ SUSP
INTRAMUSCULAR | Status: DC | PRN
  Administered 2018-06-07: 20 mL

## 2018-06-07 MED ORDER — HYDROMORPHONE HCL 1 MG/ML IJ SOLN: 0.5000 mg | INTRAMUSCULAR | Status: DC | PRN

## 2018-06-07 MED ORDER — MIDAZOLAM HCL 5 MG/5ML IJ SOLN
INTRAMUSCULAR | Status: DC | PRN
  Administered 2018-06-07: 2 mg via INTRAVENOUS

## 2018-06-07 MED ORDER — LACTATED RINGERS IV SOLN
INTRAVENOUS | Status: DC | PRN
  Administered 2018-06-07: 13:00:00 via INTRAVENOUS

## 2018-06-07 MED ORDER — POVIDONE-IODINE 7.5 % EX SOLN
Freq: Once | CUTANEOUS | Status: DC
  Filled 2018-06-07: qty 118

## 2018-06-07 MED ORDER — BISACODYL 5 MG PO TBEC: 5.0000 mg | DELAYED_RELEASE_TABLET | Freq: Every day | ORAL | Status: DC | PRN

## 2018-06-07 MED ORDER — FENTANYL CITRATE (PF) 250 MCG/5ML IJ SOLN
INTRAMUSCULAR | Status: DC | PRN
  Administered 2018-06-07 (×4): 50 ug via INTRAVENOUS

## 2018-06-07 MED ORDER — LIDOCAINE 2% (20 MG/ML) 5 ML SYRINGE
INTRAMUSCULAR | Status: AC
  Filled 2018-06-07: qty 5

## 2018-06-07 MED ORDER — ROCURONIUM BROMIDE 50 MG/5ML IV SOLN
INTRAVENOUS | Status: AC
  Filled 2018-06-07: qty 1

## 2018-06-07 MED ORDER — ZOLPIDEM TARTRATE 5 MG PO TABS: 5.0000 mg | ORAL_TABLET | Freq: Every evening | ORAL | Status: DC | PRN

## 2018-06-07 MED ORDER — OXYCODONE-ACETAMINOPHEN 5-325 MG PO TABS
1.0000 | ORAL_TABLET | ORAL | Status: DC | PRN
  Administered 2018-06-07 – 2018-06-08 (×4): 2 via ORAL
  Filled 2018-06-07 (×3): qty 2

## 2018-06-07 MED ORDER — DIAZEPAM 5 MG PO TABS
5.0000 mg | ORAL_TABLET | Freq: Four times a day (QID) | ORAL | Status: DC | PRN
  Administered 2018-06-07 – 2018-06-08 (×2): 5 mg via ORAL
  Filled 2018-06-07 (×2): qty 1

## 2018-06-07 MED ORDER — ACETAMINOPHEN 325 MG PO TABS: 650.0000 mg | ORAL_TABLET | ORAL | Status: DC | PRN

## 2018-06-07 MED ORDER — HEMOSTATIC AGENTS (NO CHARGE) OPTIME
TOPICAL | Status: DC | PRN
  Administered 2018-06-07: 1

## 2018-06-07 MED ORDER — SENNOSIDES-DOCUSATE SODIUM 8.6-50 MG PO TABS: 1.0000 | ORAL_TABLET | Freq: Every evening | ORAL | Status: DC | PRN

## 2018-06-07 MED ORDER — FLEET ENEMA 7-19 GM/118ML RE ENEM: 1.0000 | ENEMA | Freq: Once | RECTAL | Status: DC | PRN

## 2018-06-07 MED ORDER — SUFENTANIL CITRATE 250 MCG/5ML IV SOLN
0.2500 ug/kg/h | INTRAVENOUS | Status: AC
  Administered 2018-06-07: 0.5 ug/kg/h via INTRAVENOUS
  Filled 2018-06-07: qty 5

## 2018-06-07 MED ORDER — 0.9 % SODIUM CHLORIDE (POUR BTL) OPTIME
TOPICAL | Status: DC | PRN
  Administered 2018-06-07 (×2): 1000 mL

## 2018-06-07 MED ORDER — SODIUM CHLORIDE 0.9% FLUSH: 3.0000 mL | INTRAVENOUS | Status: DC | PRN

## 2018-06-07 MED ORDER — ONDANSETRON HCL 4 MG/2ML IJ SOLN: 4.0000 mg | Freq: Four times a day (QID) | INTRAMUSCULAR | Status: DC | PRN

## 2018-06-07 MED ORDER — PANTOPRAZOLE SODIUM 40 MG PO TBEC
40.0000 mg | DELAYED_RELEASE_TABLET | Freq: Every day | ORAL | Status: DC
  Administered 2018-06-07 – 2018-06-08 (×2): 40 mg via ORAL
  Filled 2018-06-07 (×2): qty 1

## 2018-06-07 MED ORDER — BACITRACIN ZINC 500 UNIT/GM EX OINT
TOPICAL_OINTMENT | CUTANEOUS | Status: AC
  Filled 2018-06-07: qty 28.35

## 2018-06-07 MED ORDER — PROMETHAZINE HCL 25 MG/ML IJ SOLN: 6.2500 mg | INTRAMUSCULAR | Status: DC | PRN

## 2018-06-07 MED ORDER — LACTATED RINGERS IV SOLN
INTRAVENOUS | Status: DC
  Administered 2018-06-07: 22:00:00 via INTRAVENOUS

## 2018-06-07 MED ORDER — MIDAZOLAM HCL 2 MG/2ML IJ SOLN
INTRAMUSCULAR | Status: AC
  Filled 2018-06-07: qty 2

## 2018-06-07 MED ORDER — HYDROMORPHONE HCL 1 MG/ML IJ SOLN: 0.2500 mg | INTRAMUSCULAR | Status: DC | PRN

## 2018-06-07 MED ORDER — BACITRACIN ZINC 500 UNIT/GM EX OINT
TOPICAL_OINTMENT | CUTANEOUS | Status: DC | PRN
  Administered 2018-06-07: 1 via TOPICAL

## 2018-06-07 MED ORDER — LISINOPRIL-HYDROCHLOROTHIAZIDE 10-12.5 MG PO TABS: 1.0000 | ORAL_TABLET | Freq: Every day | ORAL | Status: DC

## 2018-06-07 MED ORDER — LIDOCAINE 2% (20 MG/ML) 5 ML SYRINGE
INTRAMUSCULAR | Status: DC | PRN
  Administered 2018-06-07: 100 mg via INTRAVENOUS

## 2018-06-07 MED ORDER — CEFAZOLIN SODIUM-DEXTROSE 2-4 GM/100ML-% IV SOLN
INTRAVENOUS | Status: AC
  Filled 2018-06-07: qty 100

## 2018-06-07 MED ORDER — CEFAZOLIN SODIUM-DEXTROSE 2-4 GM/100ML-% IV SOLN
2.0000 g | Freq: Three times a day (TID) | INTRAVENOUS | Status: AC
  Administered 2018-06-07 – 2018-06-08 (×2): 2 g via INTRAVENOUS
  Filled 2018-06-07 (×2): qty 100

## 2018-06-07 MED ORDER — SODIUM CHLORIDE 0.9% FLUSH: 3.0000 mL | Freq: Two times a day (BID) | INTRAVENOUS | Status: DC

## 2018-06-07 MED ORDER — THROMBIN 20000 UNITS EX SOLR
CUTANEOUS | Status: DC | PRN
  Administered 2018-06-07: 20000 [IU] via TOPICAL

## 2018-06-07 MED ORDER — MENTHOL 3 MG MT LOZG: 1.0000 | LOZENGE | OROMUCOSAL | Status: DC | PRN

## 2018-06-07 MED ORDER — HYDROCHLOROTHIAZIDE 12.5 MG PO CAPS: 12.5000 mg | ORAL_CAPSULE | Freq: Every day | ORAL | Status: DC

## 2018-06-07 MED ORDER — ONDANSETRON HCL 4 MG/2ML IJ SOLN
INTRAMUSCULAR | Status: DC | PRN
  Administered 2018-06-07: 4 mg via INTRAVENOUS

## 2018-06-07 MED ORDER — BUPIVACAINE-EPINEPHRINE (PF) 0.25% -1:200000 IJ SOLN
INTRAMUSCULAR | Status: AC
  Filled 2018-06-07: qty 30

## 2018-06-07 MED ORDER — DEXTROSE 5 % IV SOLN
INTRAVENOUS | Status: DC | PRN
  Administered 2018-06-07: 30 ug/min via INTRAVENOUS

## 2018-06-07 MED ORDER — SUFENTANIL CITRATE 250 MCG/5ML IV SOLN
500.0000 ug | INTRAVENOUS | Status: DC
  Filled 2018-06-07 (×3): qty 10

## 2018-06-07 MED ORDER — ALUM & MAG HYDROXIDE-SIMETH 200-200-20 MG/5ML PO SUSP: 30.0000 mL | Freq: Four times a day (QID) | ORAL | Status: DC | PRN

## 2018-06-07 MED ORDER — PROPOFOL 500 MG/50ML IV EMUL
INTRAVENOUS | Status: DC | PRN
  Administered 2018-06-07: 125 ug/kg/min via INTRAVENOUS

## 2018-06-07 MED ORDER — ALLOPURINOL 100 MG PO TABS
100.0000 mg | ORAL_TABLET | ORAL | Status: DC
  Filled 2018-06-07: qty 1

## 2018-06-07 MED ORDER — BUPIVACAINE-EPINEPHRINE 0.25% -1:200000 IJ SOLN
INTRAMUSCULAR | Status: DC | PRN
  Administered 2018-06-07: 20 mL

## 2018-06-07 MED ORDER — SUCCINYLCHOLINE CHLORIDE 200 MG/10ML IV SOSY
PREFILLED_SYRINGE | INTRAVENOUS | Status: DC | PRN
  Administered 2018-06-07: 140 mg via INTRAVENOUS

## 2018-06-07 MED ORDER — DOCUSATE SODIUM 100 MG PO CAPS
100.0000 mg | ORAL_CAPSULE | Freq: Two times a day (BID) | ORAL | Status: DC
  Administered 2018-06-07: 100 mg via ORAL
  Filled 2018-06-07: qty 1

## 2018-06-07 MED ORDER — LISINOPRIL 10 MG PO TABS
10.0000 mg | ORAL_TABLET | Freq: Every day | ORAL | Status: DC
Start: 2018-06-07 — End: 2018-06-08

## 2018-06-07 MED ORDER — EPHEDRINE SULFATE 50 MG/ML IJ SOLN
INTRAMUSCULAR | Status: DC | PRN
  Administered 2018-06-07 (×2): 10 mg via INTRAVENOUS
  Administered 2018-06-07: 5 mg via INTRAVENOUS

## 2018-06-07 MED ORDER — LACTATED RINGERS IV SOLN
INTRAVENOUS | Status: DC
  Administered 2018-06-07 (×2): via INTRAVENOUS

## 2018-06-07 MED ORDER — PHENYLEPHRINE HCL 10 MG/ML IJ SOLN
INTRAMUSCULAR | Status: DC | PRN
  Administered 2018-06-07: 40 ug via INTRAVENOUS

## 2018-06-07 MED ORDER — ONDANSETRON HCL 4 MG PO TABS: 4.0000 mg | ORAL_TABLET | Freq: Four times a day (QID) | ORAL | Status: DC | PRN

## 2018-06-07 MED ORDER — VITAMIN D 1000 UNITS PO TABS: 2000.0000 [IU] | ORAL_TABLET | Freq: Every day | ORAL | Status: DC

## 2018-06-07 MED ORDER — CEFAZOLIN SODIUM-DEXTROSE 2-4 GM/100ML-% IV SOLN
2.0000 g | INTRAVENOUS | Status: AC
  Administered 2018-06-07: 2 g via INTRAVENOUS

## 2018-06-07 MED ORDER — ACETAMINOPHEN 650 MG RE SUPP: 650.0000 mg | RECTAL | Status: DC | PRN

## 2018-06-07 MED ORDER — PHENOL 1.4 % MT LIQD: 1.0000 | OROMUCOSAL | Status: DC | PRN

## 2018-06-07 MED ORDER — PHENYLEPHRINE 40 MCG/ML (10ML) SYRINGE FOR IV PUSH (FOR BLOOD PRESSURE SUPPORT)
PREFILLED_SYRINGE | INTRAVENOUS | Status: AC
  Filled 2018-06-07: qty 20

## 2018-06-07 MED ORDER — FENTANYL CITRATE (PF) 250 MCG/5ML IJ SOLN
INTRAMUSCULAR | Status: AC
  Filled 2018-06-07: qty 5

## 2018-06-07 MED ORDER — PROPOFOL 10 MG/ML IV BOLUS
INTRAVENOUS | Status: DC | PRN
  Administered 2018-06-07: 200 mg via INTRAVENOUS

## 2018-06-07 SURGICAL SUPPLY — 84 items
BENZOIN TINCTURE PRP APPL 2/3 (GAUZE/BANDAGES/DRESSINGS) ×6 IMPLANT
BIT DRILL MOUNTAINEER FIX 14 (BIT) ×1
BIT DRILL MOUNTAINEER FIX 14MM (BIT) IMPLANT
BIT DRILL MOUNTAINER FXED 12MM (DRILL) IMPLANT
BLADE CLIPPER SURG NEURO (BLADE) ×3 IMPLANT
BONE VIVIGEN FORMABLE 1.3CC (Bone Implant) ×3 IMPLANT
BUR NEURO DRILL SOFT 3.0X3.8M (BURR) ×3 IMPLANT
BUR PRESCISION 1.7 ELITE (BURR) ×3 IMPLANT
CLOSURE STERI-STRIP 1/2X4 (GAUZE/BANDAGES/DRESSINGS) ×1
CLOSURE WOUND 1/2 X4 (GAUZE/BANDAGES/DRESSINGS)
CLSR STERI-STRIP ANTIMIC 1/2X4 (GAUZE/BANDAGES/DRESSINGS) ×1 IMPLANT
CONNECTOR MTN HEAD TO HEAD 35M (Orthopedic Implant) ×2 IMPLANT
CONT SPEC 4OZ CLIKSEAL STRL BL (MISCELLANEOUS) ×3 IMPLANT
CORDS BIPOLAR (ELECTRODE) ×3 IMPLANT
COVER BACK TABLE 80X110 HD (DRAPES) ×3 IMPLANT
COVER SURGICAL LIGHT HANDLE (MISCELLANEOUS) ×3 IMPLANT
DRAIN CHANNEL 15F RND FF W/TCR (WOUND CARE) ×3 IMPLANT
DRAPE C-ARM 42X72 X-RAY (DRAPES) ×3 IMPLANT
DRAPE HALF SHEET 40X57 (DRAPES) ×15 IMPLANT
DRAPE INCISE IOBAN 66X45 STRL (DRAPES) ×3 IMPLANT
DRAPE PED LAPAROTOMY (DRAPES) ×3 IMPLANT
DRAPE POUCH INSTRU U-SHP 10X18 (DRAPES) ×3 IMPLANT
DRAPE SURG 17X23 STRL (DRAPES) ×24 IMPLANT
DRILL BIT MOUNTAINEER FIX 14MM (BIT) ×2
DRILL MOUNTAINEER FIXED 12MM (DRILL) ×3
DRSG MEPILEX BORDER 4X8 (GAUZE/BANDAGES/DRESSINGS) ×3 IMPLANT
DURAPREP 26ML APPLICATOR (WOUND CARE) ×3 IMPLANT
ELECT CAUTERY BLADE 6.4 (BLADE) ×3 IMPLANT
ELECT REM PT RETURN 9FT ADLT (ELECTROSURGICAL) ×3
ELECTRODE REM PT RTRN 9FT ADLT (ELECTROSURGICAL) ×1 IMPLANT
EVACUATOR SILICONE 100CC (DRAIN) ×3 IMPLANT
FEE INTRAOP MONITOR IMPULS NCS (MISCELLANEOUS) IMPLANT
GAUZE SPONGE 4X4 12PLY STRL (GAUZE/BANDAGES/DRESSINGS) ×3 IMPLANT
GAUZE SPONGE 4X4 16PLY XRAY LF (GAUZE/BANDAGES/DRESSINGS) ×3 IMPLANT
GLOVE BIO SURGEON STRL SZ7 (GLOVE) ×3 IMPLANT
GLOVE BIO SURGEON STRL SZ8 (GLOVE) ×3 IMPLANT
GLOVE BIOGEL PI IND STRL 8 (GLOVE) ×1 IMPLANT
GLOVE BIOGEL PI INDICATOR 8 (GLOVE) ×2
GLOVE SURG SS PI 7.0 STRL IVOR (GLOVE) ×4 IMPLANT
GOWN STRL REUS W/ TWL LRG LVL3 (GOWN DISPOSABLE) ×4 IMPLANT
GOWN STRL REUS W/ TWL XL LVL3 (GOWN DISPOSABLE) ×1 IMPLANT
GOWN STRL REUS W/TWL LRG LVL3 (GOWN DISPOSABLE) ×8
GOWN STRL REUS W/TWL XL LVL3 (GOWN DISPOSABLE) ×2
GRAFT BNE MATRIX VG FRMBL SM 1 (Bone Implant) IMPLANT
INTRAOP MONITOR FEE IMPULS NCS (MISCELLANEOUS) ×1
INTRAOP MONITOR FEE IMPULSE (MISCELLANEOUS) ×2
IV CATH 14GX2 1/4 (CATHETERS) ×3 IMPLANT
KIT BASIN OR (CUSTOM PROCEDURE TRAY) ×3 IMPLANT
KIT TURNOVER KIT B (KITS) ×3 IMPLANT
NDL HYPO 25GX1X1/2 BEV (NEEDLE) ×1 IMPLANT
NEEDLE HYPO 25GX1X1/2 BEV (NEEDLE) ×3 IMPLANT
NS IRRIG 1000ML POUR BTL (IV SOLUTION) ×5 IMPLANT
NUT HH X CONN OUTER (Orthopedic Implant) ×4 IMPLANT
PACK LAMINECTOMY ORTHO (CUSTOM PROCEDURE TRAY) ×3 IMPLANT
PAD ARMBOARD 7.5X6 YLW CONV (MISCELLANEOUS) ×6 IMPLANT
PATTIES SURGICAL .5 X.5 (GAUZE/BANDAGES/DRESSINGS) ×3 IMPLANT
PIN MAYFIELD SKULL DISP (PIN) ×3 IMPLANT
PUTTY DBX 1CC (Putty) ×3 IMPLANT
PUTTY DBX 1CC DEPUY (Putty) IMPLANT
ROD MOUNTAINEER 120MM (Rod) ×4 IMPLANT
ROD TEMPLATE MOUNTAINEER 120MM (ROD) ×2 IMPLANT
SCREW F A 3.5X12 (Screw) ×4 IMPLANT
SCREW F A 3.5X14 (Screw) ×8 IMPLANT
SCREW HH X CONN INNER (Screw) ×4 IMPLANT
SCREW INNER (Screw) ×12 IMPLANT
SPONGE GAUZE 4X4 16PLY UNSTER (WOUND CARE) ×3 IMPLANT
SPONGE INTESTINAL PEANUT (DISPOSABLE) IMPLANT
SPONGE SURGIFOAM ABS GEL 100 (HEMOSTASIS) ×3 IMPLANT
STRIP CLOSURE SKIN 1/2X4 (GAUZE/BANDAGES/DRESSINGS) IMPLANT
SURGIFLO W/THROMBIN 8M KIT (HEMOSTASIS) ×3 IMPLANT
SUT MNCRL AB 4-0 PS2 18 (SUTURE) ×3 IMPLANT
SUT VIC AB 0 CT1 18XCR BRD 8 (SUTURE) ×1 IMPLANT
SUT VIC AB 0 CT1 8-18 (SUTURE) ×2
SUT VIC AB 1 CT1 18XCR BRD 8 (SUTURE) ×2 IMPLANT
SUT VIC AB 1 CT1 8-18 (SUTURE) ×8
SUT VIC AB 2-0 CT2 18 VCP726D (SUTURE) ×5 IMPLANT
SYR BULB IRRIGATION 50ML (SYRINGE) ×3 IMPLANT
SYR CONTROL 10ML LL (SYRINGE) ×9 IMPLANT
TAP MOUNTAINEER 3MM (TAP) ×2 IMPLANT
TAPE CLOTH 4X10 WHT NS (GAUZE/BANDAGES/DRESSINGS) ×3 IMPLANT
TOWEL OR 17X24 6PK STRL BLUE (TOWEL DISPOSABLE) ×3 IMPLANT
TOWEL OR 17X26 10 PK STRL BLUE (TOWEL DISPOSABLE) ×3 IMPLANT
TRAY FOLEY MTR SLVR 16FR STAT (SET/KITS/TRAYS/PACK) ×3 IMPLANT
YANKAUER SUCT BULB TIP NO VENT (SUCTIONS) ×3 IMPLANT

## 2018-06-07 NOTE — Anesthesia Procedure Notes (Signed)
Procedure Name: Intubation Date/Time: 06/07/2018 2:20 PM Performed by: Army FossaPulliam, Court Gracia Dane, CRNA Pre-anesthesia Checklist: Patient identified, Emergency Drugs available, Suction available and Patient being monitored Patient Re-evaluated:Patient Re-evaluated prior to induction Oxygen Delivery Method: Circle System Utilized Preoxygenation: Pre-oxygenation with 100% oxygen Induction Type: IV induction Ventilation: Mask ventilation without difficulty Laryngoscope Size: Glidescope and 4 Grade View: Grade II Tube type: Oral Tube size: 7.5 mm Number of attempts: 1 Airway Equipment and Method: Stylet,  Oral airway and Video-laryngoscopy Placement Confirmation: positive ETCO2 and breath sounds checked- equal and bilateral (ETT placement confirmed through open vocal cords via video laryngoscopy. ) Secured at: 22 cm Tube secured with: Tape Dental Injury: Teeth and Oropharynx as per pre-operative assessment

## 2018-06-07 NOTE — Transfer of Care (Signed)
Immediate Anesthesia Transfer of Care Note  Patient: Gavin LandsmanBobby Khan  Procedure(s) Performed: C3-C6 POSTERIOR CERVICAL DECOMPRESSION FUSION WITH INSTRUMENTATION AND ALLOGRAFT.  TIME REQUESTED 4 HOURS (N/A Spine Cervical)  Patient Location: PACU  Anesthesia Type:General  Level of Consciousness: awake  Airway & Oxygen Therapy: Patient Spontanous Breathing and Patient connected to face mask oxygen  Post-op Assessment: Report given to RN and Post -op Vital signs reviewed and stable  Post vital signs: Reviewed and stable  Last Vitals:  Vitals Value Taken Time  BP 138/75 06/07/2018  6:33 PM  Temp 36.4 C 06/07/2018  6:32 PM  Pulse 100 06/07/2018  6:37 PM  Resp 12 06/07/2018  6:37 PM  SpO2 100 % 06/07/2018  6:37 PM  Vitals shown include unvalidated device data.  Last Pain:  Vitals:   06/07/18 1832  TempSrc:   PainSc: 0-No pain         Complications: No apparent anesthesia complications

## 2018-06-07 NOTE — Plan of Care (Signed)
  Problem: Activity: Goal: Risk for activity intolerance will decrease Outcome: Progressing   Problem: Elimination: Goal: Will not experience complications related to bowel motility Outcome: Progressing   Problem: Pain Managment: Goal: General experience of comfort will improve Outcome: Progressing   Problem: Safety: Goal: Ability to remain free from injury will improve Outcome: Progressing   

## 2018-06-07 NOTE — Anesthesia Postprocedure Evaluation (Signed)
Anesthesia Post Note  Patient: Gavin LandsmanBobby Khan  Procedure(s) Performed: C3-C6 POSTERIOR CERVICAL DECOMPRESSION FUSION WITH INSTRUMENTATION AND ALLOGRAFT.  TIME REQUESTED 4 HOURS (N/A Spine Cervical)     Patient location during evaluation: PACU Anesthesia Type: General Level of consciousness: awake and alert Pain management: pain level controlled Vital Signs Assessment: post-procedure vital signs reviewed and stable Respiratory status: spontaneous breathing, nonlabored ventilation, respiratory function stable and patient connected to nasal cannula oxygen Cardiovascular status: blood pressure returned to baseline and stable Postop Assessment: no apparent nausea or vomiting Anesthetic complications: no    Last Vitals:  Vitals:   06/07/18 1918 06/07/18 1930  BP: 135/85   Pulse: 90   Resp: 12   Temp:  36.5 C  SpO2: 97%     Last Pain:  Vitals:   06/07/18 1930  TempSrc:   PainSc: 3                  Cohen Doleman DAVID

## 2018-06-07 NOTE — Op Note (Signed)
Gavin Khan, Gavin Khan MEDICAL RECORD RU:[id number] DATE OF BIRTH:February 01, 1976 FACILITY: [facility] PHYSICIAN:Ladena Jacquez Noreene Larsson, MD  OPERATIVE REPORT  DATE OF PROCEDURE:  06/07/2018  PREOPERATIVE DIAGNOSES:   1.  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 2.  Severe spinal cord compression spanning C3-4 and C4-5, with myelomalacia involving C4-5 and C5-6. 3.  Status post anterior cervical fusion procedure.  POSTOPERATIVE DIAGNOSES: 1.  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 2.  Severe spinal cord compression spanning C3-4 and C4-5, with myelomalacia involving C4-5 and C5-6. 3.  Status post anterior cervical fusion procedure.  PROCEDURES: 1.  Posterior cervical fusion C3-4, C4-5, C5-6. 2.  Posterior cervical laminectomy with removal of the C4 and C5 lamina, with decompression of the C3-4, C4-5, and C5-6 intervertebral spaces. 3.  Placement of posterior segmental instrumentation bilaterally at C3, C5, and C6. 4.  Intraoperative use of fluoroscopy. 5.  Cranial tong application and removal.  SURGEON:  Estill Bamberg, MD  ASSISTANT:  Jason Coop PA-C  ANESTHESIA:  General endotracheal anesthesia.  COMPLICATIONS:  None.  DISPOSITION:  Stable.  ESTIMATED BLOOD LOSS:  100 mL.  INDICATIONS FOR SURGERY:  Briefly, the patient is a very pleasant 42 year old male who is status post a previous cervical fusion procedure by me.  The patient did sustain [place], where he did hit his head and was  unable to move his arms and legs for approximately 1 minute.  He was transferred emergently to [facility], where I did evaluate him.  At the time of my evaluation with him, he did have full strength, but did report tingling in his  bilateral hands.  His MRI did  clearly reveal severe spinal stenosis involving C3-4 and C4-5, and to a lesser extent, C5-6.  There was a rather obvious myelomalacia involving the C3-4, C4-5 and C5-6 levels.  Given the severity of the cord compression and  myelomalacia and his transient neurologic deficit, we did discuss proceeding with a posterior cervical decompression and fusion procedure.  The patient did wish to proceed after full understanding of the risks and benefits of surgery.  OPERATIVE DETAILS:  On 06/07/2018, the patient was brought to surgery, and general endotracheal anesthesia was administered.  A [place] head holder was applied by me.  The patient was then rolled prone.  The patient's head was secured into the  appropriate position.  His arms were secured at his sides.  The neck was prepped and draped in the usual sterile fashion.  A timeout procedure was performed.  At this point, a midline incision was made spanning approximately C3-C7.  The fascia was  incised at the midline.  A lateral intraoperative radiograph to confirm the appropriate operative levels.  I then subperiosteally exposed the lateral masses of C3, C4, C5, and C6, as well as the associated facet joints on the right and left sides.  Using  a 1.7 mm bur, I did decorticate the facet joints from C3-4 to C6-7.  Using anatomic landmarks, I did cannulate the lateral masses of C3, C5, and C6, using an upward and lateral trajectory.  I did use a 3 mm tap to prepare the trajectory of the lateral  mass screws.  Bone wax was placed in the cannulated holes.  At this point, I proceeded with the decompressive portion of the procedure.  The central 15 mm of lamina was removed using a 3 mm bur on the right and left sides.  The C4 and C5 lamina were  removed en bloc, thereby decompressing the C3-4, C4-5, and C5-6 levels.  At this point, the wound was copiously irrigated with normal saline using approximately 2 L of irrigation.  Of note, I did use neurologic monitoring  throughout the surgery,  including motor-evoked potentials.  Signals were variable throughout the case, thought to be due to an anesthetic effect.  It was not felt by the reading neurologist that there was any true neurologic deficit throughout the surgery.  At this point, a 3  mm bur was used to decorticate the posterior elements from C3-C7.  DBX putty was packed into the facet joints, as was ViviGen, which was packed along the posterior elements.  At this point, 3.5 x 14 mm screws were placed at C3 and C5 bilaterally, and 3.5  x 12 mm screws were placed into the C6 lateral masses bilaterally.  A rod was cut into the appropriate degree of lordosis and secured into the tulip heads of the screws.  A crosslink was placed across the C6 level.  I was very pleased with the final AP  and lateral fluoroscopic images.  At this point, the wound was closed in layers using #1 Vicryl followed by 2-0 Vicryl followed by 4-0 Monocryl.  Benzoin and Steri-Strips were applied followed by sterile dressing.  All instrument counts were correct at  the termination of the procedure. The patient was then rolled supine and the head holder was removed and he was extubated.   Of note, Jason CoopKayla McKenzie was my assistant throughout the surgery and did aid in retraction, suctioning, and closure.  LN/NUANCE  D:06/07/2018 T:06/07/2018 JOB:[id number]

## 2018-06-07 NOTE — H&P (Signed)
As previously noted, patient has spinal cord compression at C3/4 and C4/5 with myelomalacia noted at C4/5 and C5/6. Will proceed with posterior cervical decompression and fusion, as previously documented.

## 2018-06-07 NOTE — Anesthesia Preprocedure Evaluation (Signed)
Anesthesia Evaluation  Patient identified by MRN, date of birth, ID band Patient awake  General Assessment Comment:CT is negative fracture, notable for bony spurs at C4/5 with a solid fusion from C4-T1. 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   Reviewed: Allergy & Precautions, NPO status , Patient's Chart, lab work & pertinent test results  Airway Mallampati: II  TM Distance: >3 FB Neck ROM: Limited    Dental no notable dental hx.    Pulmonary neg pulmonary ROS,    Pulmonary exam normal breath sounds clear to auscultation       Cardiovascular hypertension, Normal cardiovascular exam Rhythm:Regular Rate:Normal     Neuro/Psych negative neurological ROS  negative psych ROS   GI/Hepatic negative GI ROS, Neg liver ROS,   Endo/Other  negative endocrine ROS  Renal/GU negative Renal ROS  negative genitourinary   Musculoskeletal negative musculoskeletal ROS (+)   Abdominal   Peds negative pediatric ROS (+)  Hematology negative hematology ROS (+)   Anesthesia Other Findings   Reproductive/Obstetrics negative OB ROS                             Anesthesia Physical Anesthesia Plan  ASA: II  Anesthesia Plan: General   Post-op Pain Management:    Induction: Intravenous  PONV Risk Score and Plan: 2 and Ondansetron, Dexamethasone and Treatment may vary due to age or medical condition  Airway Management Planned: Oral ETT and Video Laryngoscope Planned  Additional Equipment:   Intra-op Plan:   Post-operative Plan: Extubation in OR  Informed Consent: I have reviewed the patients History and Physical, chart, labs and discussed the procedure including the risks, benefits and alternatives for the proposed anesthesia with the patient or authorized representative who has indicated his/her understanding and acceptance.    Dental advisory given  Plan Discussed with: CRNA and Surgeon  Anesthesia Plan Comments:         Anesthesia Quick Evaluation

## 2018-06-08 MED ORDER — OXYCODONE-ACETAMINOPHEN 5-325 MG PO TABS: 1.0000 | ORAL_TABLET | ORAL | 0 refills | Status: AC | PRN

## 2018-06-08 MED ORDER — DIAZEPAM 5 MG PO TABS: 5.0000 mg | ORAL_TABLET | Freq: Three times a day (TID) | ORAL | 0 refills | Status: AC | PRN

## 2018-06-08 MED FILL — Sodium Chloride IV Soln 0.9%: INTRAVENOUS | Qty: 1000 | Status: AC

## 2018-06-08 MED FILL — Heparin Sodium (Porcine) Inj 1000 Unit/ML: INTRAMUSCULAR | Qty: 30 | Status: AC

## 2018-06-08 MED FILL — Thrombin (Recombinant) For Soln 20000 Unit: CUTANEOUS | Qty: 1 | Status: AC

## 2018-06-08 NOTE — Progress Notes (Addendum)
Occupational Therapy Evaluation Patient Details Name: Gavin Khan MRN: [id number] DOB: [birth year] Today's Date: 06/08/2018    History of Present Illness Pt is a 42 y/o male s/p posterior cervial fusion and deompression after falling.  PMH significant for ACDF, lumbar laminectomy/decompression, HTN.    Clinical Impression   PTA patient was independent with ADL, IADL and mobility.  Currently he requires supervision for toileting and LB ADL, mod assist for UB dressing (spouse to assist at dc, increased assist due to tightness of shirt and collar; education on increased ease with shirt, technique), independent for toilet transfers, declined shower transfer but verbally educated on technique and spouse will assist at dc. Agreeable to recommendations to sit in shower (plan to purchase shower chair once dc'd), understands shower collar.  Independently following precautions without cueing. Reports tingling improved on L UE (fingertips) and resolved in R UE.  Will have support from spouse at dc.  Patient and spouse have no further questions or concerns.  Educated on precautions, safety, compensatory techniques, and functional transfers.  No follow up OT recommended.  Thank you for this referral.  OT signing OFF.     Follow Up Recommendations  No OT follow up;Supervision - Intermittent    Equipment Recommendations  Tub/shower seat(plans to purchase once home )    Recommendations for Other Services       Precautions / Restrictions Precautions Precautions: Fall;Cervical Precaution Booklet Issued: No Precaution Comments: written handout issued in prior PT session, reviewed precautions able to verbalize independently Required Braces or Orthoses: Cervical Brace Cervical Brace: Hard collar;At all times(able to shower with shower collar after 5 days ) Restrictions Weight Bearing Restrictions: No      Mobility Bed Mobility Overal bed mobility: Modified Independent             General bed  mobility comments: simulated home setup, able to complete log roll technique without assistance or cueing   Transfers Overall transfer level: Independent                    Balance Overall balance assessment: No apparent balance deficits (not formally assessed)                                         ADL either performed or assessed with clinical judgement   ADL Overall ADL's : Needs assistance/impaired Eating/Feeding: Modified independent   Grooming: Standing;Modified independent   Upper Body Bathing: Modified independent;Sitting   Lower Body Bathing: Supervison/ safety;Adhering to back precautions;Sit to/from stand Lower Body Bathing Details (indicate cue type and reason): edu completing seated, use of shower collar Upper Body Dressing : Moderate assistance;Sitting Upper Body Dressing Details (indicate cue type and reason): reports spouse assist, edu on technique for independnece; verbalized understanding  Lower Body Dressing: Supervision/safety;Sit to/from stand Lower Body Dressing Details (indicate cue type and reason): figure 4 technique to manage socks/.shoes Toilet Transfer: Ambulation;Comfort height toilet;Modified Independent Toilet Transfer Details (indicate cue type and reason): good technique to adhere to precautions Toileting- Clothing Manipulation and Hygiene: Modified independent;Sit to/from stand;Adhering to back precautions   Tub/ Shower Transfer: Supervision/safety;Ambulation Tub/Shower Transfer Details (indicate cue type and reason): edu on safe technique; declines to practice; patient spouse will assist; recommend shower chair (plan to self purchase) Functional mobility during ADLs: Independent General ADL Comments: highly motivated, good adherance to precautions without cueing throughout session     Vision Baseline Vision/History: Wears  glasses Vision Assessment?: No apparent visual deficits     Perception     Praxis       Pertinent Vitals/Pain Pain Assessment: Faces Faces Pain Scale: Hurts a little bit Pain Location: surgical site Pain Descriptors / Indicators: Discomfort Pain Intervention(s): Monitored during session     Hand Dominance     Extremity/Trunk Assessment Upper Extremity Assessment Upper Extremity Assessment: Overall WFL for tasks assessed(within precaution limitations (shoulder flexion limited 90))   Lower Extremity Assessment Lower Extremity Assessment: Defer to PT evaluation   Cervical / Trunk Assessment Cervical / Trunk Assessment: Other exceptions(s/p posterior cervical fustion and decompression)   Communication Communication Communication: No difficulties   Cognition Arousal/Alertness: Awake/alert Behavior During Therapy: WFL for tasks assessed/performed Overall Cognitive Status: Within Functional Limits for tasks assessed                                     General Comments  spouse present and supportive    Exercises     Shoulder Instructions      Home Living Family/patient expects to be discharged to:: Private residence Living Arrangements: Spouse/significant other Available Help at Discharge: Family Type of Home: House Home Access: Stairs to enter Secretary/administratorntrance Stairs-Number of Steps: 4 Entrance Stairs-Rails: Right Home Layout: Two level Alternate Level Stairs-Number of Steps: 12 Alternate Level Stairs-Rails: Can reach both Bathroom Shower/Tub: Producer, television/film/videoWalk-in shower   Bathroom Toilet: Handicapped height     Home Equipment: None          Prior Functioning/Environment Level of Independence: Independent        Comments: independent ADL, IADL (works from home and drives)        OT Problem List: Decreased activity tolerance;Decreased knowledge of use of DME or AE;Decreased knowledge of precautions      OT Treatment/Interventions:      OT Goals(Current goals can be found in the care plan section) Acute Rehab OT Goals Patient Stated Goal: to  go home today OT Goal Formulation: With patient/family  OT Frequency:     Barriers to D/C:            Co-evaluation              AM-PAC PT "6 Clicks" Daily Activity     Outcome Measure Help from another person eating meals?: None Help from another person taking care of personal grooming?: None Help from another person toileting, which includes using toliet, bedpan, or urinal?: A Little Help from another person bathing (including washing, rinsing, drying)?: A Little Help from another person to put on and taking off regular upper body clothing?: A Little Help from another person to put on and taking off regular lower body clothing?: A Little 6 Click Score: 20   End of Session Equipment Utilized During Treatment: Cervical collar Nurse Communication: Mobility status  Activity Tolerance: Patient tolerated treatment well Patient left: with call bell/phone within reach;with family/visitor present(seated EOB)  OT Visit Diagnosis: Other abnormalities of gait and mobility (R26.89)                Time: 1610-9604: 0858-0910 OT Time Calculation (min): 12 min Charges:  OT General Charges $OT Visit: 1 Visit OT Evaluation $OT Eval Low Complexity: 1 Low G-Codes:     Chancy Milroyhristie S Pacey Willadsen, OTR/L  Pager [phone]   Chancy MilroyChristie S Azeez Dunker 06/08/2018, 11:25 AM

## 2018-06-08 NOTE — Evaluation (Signed)
Physical Therapy Evaluation and Discharge Patient Details Name: Gavin Khan MRN: [id number] DOB: [birth year] Today's Date: 06/08/2018   History of Present Illness  Pt is a 42 y.o. M s/p posterior cervical fusion and decompression  Clinical Impression  Patient evaluated by Physical Therapy with no further acute PT needs identified. All education has been completed and the patient has no further questions. Patient walking > 400 feet independently with no assistive device without difficulty. Negotiated 12 steps modified independently to prepare for discharge home. Verbalized understanding with precautions. See below for any follow-up Physical Therapy or equipment needs. PT is signing off. Thank you for this referral.     Follow Up Recommendations No PT follow up    Equipment Recommendations  None recommended by PT    Recommendations for Other Services       Precautions / Restrictions Precautions Precautions: Fall;Cervical Precaution Booklet Issued: Yes (comment) Precaution Comments: reviewed precautions and provided written handout Required Braces or Orthoses: Cervical Brace Cervical Brace: Hard collar;At all times Restrictions Weight Bearing Restrictions: No      Mobility  Bed Mobility               General bed mobility comments: OOB in hallway walking with his wife  Transfers Overall transfer level: Independent                  Ambulation/Gait Ambulation/Gait assistance: Independent Gait Distance (Feet): 400 Feet Assistive device: None Gait Pattern/deviations: WFL(Within Functional Limits)     General Gait Details: bilateral foot external rotation throughout gait but suspect this is baseline  Stairs Stairs: Yes Stairs assistance: Modified independent (Device/Increase time) Stair Management: One rail Left Number of Stairs: 12 General stair comments: reviewed not looking down at steps secondary to precautions but rather feeling for step with foot. step  over step pattern utilized  Engineer, drillingWheelchair Mobility    Modified Rankin (Stroke Patients Only)       Balance Overall balance assessment: No apparent balance deficits (not formally assessed)                                           Pertinent Vitals/Pain Pain Assessment: Faces Faces Pain Scale: Hurts a little bit Pain Location: surgical site Pain Descriptors / Indicators: Guarding Pain Intervention(s): Monitored during session    Home Living Family/patient expects to be discharged to:: Private residence Living Arrangements: Spouse/significant other Available Help at Discharge: Family Type of Home: House Home Access: Stairs to enter Entrance Stairs-Rails: Right Entrance Stairs-Number of Steps: 4 Home Layout: Two level Home Equipment: None      Prior Function Level of Independence: Independent         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 [employer]      Hand Dominance        Extremity/Trunk Assessment   Upper Extremity Assessment Upper Extremity Assessment: Defer to OT evaluation    Lower Extremity Assessment Lower Extremity Assessment: Overall WFL for tasks assessed    Cervical / Trunk Assessment Cervical / Trunk Assessment: Normal  Communication   Communication: No difficulties  Cognition Arousal/Alertness: Awake/alert Behavior During Therapy: WFL for tasks assessed/performed Overall Cognitive Status: Within Functional Limits for tasks assessed  General Comments      Exercises     Assessment/Plan    PT Assessment Patent does not need any further PT services  PT Problem List         PT Treatment Interventions      PT Goals (Current goals can be found in the Care Plan section)  Acute Rehab PT Goals Patient Stated Goal: be active PT Goal Formulation: All assessment and education complete, DC therapy    Frequency     Barriers to discharge         Co-evaluation               AM-PAC PT "6 Clicks" Daily Activity  Outcome Measure Difficulty turning over in bed (including adjusting bedclothes, sheets and blankets)?: None Difficulty moving from lying on back to sitting on the side of the bed? : None Difficulty sitting down on and standing up from a chair with arms (e.g., wheelchair, bedside commode, etc,.)?: None Help needed moving to and from a bed to chair (including a wheelchair)?: None Help needed walking in hospital room?: None Help needed climbing 3-5 steps with a railing? : None 6 Click Score: 24    End of Session Equipment Utilized During Treatment: Cervical collar Activity Tolerance: Patient tolerated treatment well Patient left: with family/visitor present;Other (comment)(standing in room) Nurse Communication: Mobility status PT Visit Diagnosis: Difficulty in walking, not elsewhere classified (R26.2);Pain Pain - part of body: (cervical)    Time: 1610-9604 PT Time Calculation (min) (ACUTE ONLY): 10 min   Charges:   PT Evaluation $PT Eval Low Complexity: 1 Low     PT G Codes:        Laurina Bustle, PT, DPT Acute Rehabilitation Services  Pager: [phone]   Vanetta Mulders 06/08/2018, 10:26 AM

## 2018-06-08 NOTE — Progress Notes (Signed)
    Patient doing well  Has been ambulating  Strength is full Moderate expected neck pain Right hand tingling is resolved. Left hand tingling significantly improved   Physical Exam: Vitals:   06/07/18 2330 06/08/18 0346  BP: (!) 148/84 (!) 141/97  Pulse: 74 63  Resp: 20 20  Temp: 97.8 F (36.6 C) 98.4 F (36.9 C)  SpO2: 100% 96%   Hard collar is applied appropriately Dressing in place NVI  POD #1 s/p C3-C6 posterior cervical fusion and decompression, doing very well  - encourage ambulation - Percocet for pain, Valium for muscle spasms - d/c home today with f/u in 2-3 weeks

## 2018-06-08 NOTE — Progress Notes (Signed)
Patient alert and oriented, mae's well, voiding adequate amount of urine, swallowing without difficulty, no c/o pain at time of discharge. Patient discharged home with family. Script and discharged instructions given to patient. Patient and family stated understanding of instructions given. Patient has an appointment with Dr. Dumonski 

## 2018-06-12 ENCOUNTER — Encounter (HOSPITAL_COMMUNITY): Payer: Self-pay | Admitting: Orthopedic Surgery

## 2018-06-14 ENCOUNTER — Other Ambulatory Visit: Payer: Self-pay | Admitting: *Deleted

## 2018-06-14 NOTE — Patient Outreach (Signed)
[facility] Care Management  06/14/2018  Lin LandsmanBobby Wynn 20-Oct-1976 [id number]   Subjective: Telephone call to patient's home number, no answer, left HIPAA compliant voicemail message, and requested call back.    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  Status post Posterior cervical fusion C3-4, C4-5, C5-6, Posterior cervical laminectomy with removal of the C4 and C5 lamina, with decompression of the C3-4, C4-5, and C5-6 intervertebral spaces, Placement of posterior segmental instrumentation bilaterally at C3, C5, and C6 on 06/07/18.   Patient also has a history of hypertension.    Assessment: Received [employer] Transition of care referral on 06/14/18.  Transition of care follow up pending patient contact.       Plan: RNCM will send unsuccessful outreach  letter, [facility], will ask covering RNCM to call patient for 2nd telephone outreach attempt within 4 business days, transition of care follow up, and proceed with case closure, within 10 business days if no return call.     Miyeko Mahlum H. Gardiner Barefootooper RN, BSN, CCM [facility] Care [facility] Telephonic CM Phone: [phone] Fax: [phone]

## 2018-06-16 NOTE — Telephone Encounter (Signed)
This encounter was created in error - please disregard.

## 2018-06-19 ENCOUNTER — Other Ambulatory Visit: Payer: Self-pay

## 2018-06-19 NOTE — Patient Outreach (Signed)
[facility] Care Management  06/19/2018  Gavin LandsmanBobby Khan 03/20/1976 [id number]   Transition of care  Referral date: 06/14/18 Referral Reason :  Status post Posterior cervical fusion C3-4, C4-5, C5-6, Posterior cervical laminectomy with removal of the C4 and C5 lamina, with decompression of the C3-4, C4-5, and C5-6 intervertebral spaces, Placement of posterior segmental instrumentation bilaterally at C3, C5, and C6 on 6/26/119 Insurance: [employer]  Telephone call to patient regarding transition of care referral. HIPAA verified. Patient reports he is doing fine.  Denies having any problems at this time. Patient states he is scheduled to see the surgeon on 06/21/09.  Patient states he does not have a scheduled appointment with his primary MD.  States he has spoke with his primary MD office after discharge from the hospital. Patient reports his pain level is at a 1 or 2 at this time. States he has not had to take pain medication for 5 days.  Patient reports having and taking his medication as prescribed. Patient reports having transportation for MD appointments.  RNCM reviewed signs/ symptoms of infection with patient. RNCM advised patient to call doctor for infection symptoms.  Patient denies having any symptoms.  States incision is healing well.  Patient denies any further needs at this time.    PLAN:  RNCM will close patient due to patient being assessed and having no further needs.  RNCM will send patient successful outreach letter.  George InaDavina Babe Anthis RN,BSN,CCM [facility] Telephonic  [phone]

## 2018-06-28 NOTE — Discharge Summary (Signed)
Patient ID: Gavin Khan MRN: [id number] DOB/AGE: 1976-05-02 42 y.o.  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 Hypertension     Surgeries: Procedure(s): C3-C6 POSTERIOR CERVICAL DECOMPRESSION FUSION WITH INSTRUMENTATION AND ALLOGRAFT.  TIME REQUESTED 4 HOURS on 06/07/2018   Consultants: NONE  Discharged Condition: Improved  Hospital Course: Gavin Khan is an 42 y.o. male who was admitted 06/04/2018 for operative treatment of MYELOPATHY. Patient has severe unremitting pain that affects sleep, daily activities, and work/hobbies. After pre-op clearance the patient was taken to the operating room on 06/07/2018 and underwent  Procedure(s): C3-C6 POSTERIOR CERVICAL DECOMPRESSION FUSION WITH INSTRUMENTATION AND ALLOGRAFT.  TIME REQUESTED 4 HOURS.    Patient was given perioperative antibiotics:  Anti-infectives (From admission, onward)   Start     Dose/Rate Route Frequency Ordered Stop   06/08/18 0600  ceFAZolin (ANCEF) IVPB 2g/100 mL premix     2 g 200 mL/hr over 30 Minutes Intravenous On call to O.R. 06/07/18 1228 06/07/18 1437   06/07/18 2200  ceFAZolin (ANCEF) IVPB 2g/100 mL premix     2 g 200 mL/hr over 30 Minutes Intravenous Every 8 hours 06/07/18 1954 06/08/18 0529   06/07/18 1229  ceFAZolin (ANCEF) 2-4 GM/100ML-% IVPB    Note to Pharmacy:  Posey Pronto   : cabinet override      06/07/18 1229 06/07/18 1432       Patient was given sequential compression devices, early ambulation to prevent DVT.  Patient benefited maximally from hospital stay and there were no complications.    Recent vital signs: BP 133/87 (BP Location: Right Arm)   Pulse 90   Temp 98.3 F (36.8 C)   Resp 18   Ht 6\' 2"  (1.88 m)   Wt 112.5 kg (248 lb)    SpO2 97%   BMI 31.84 kg/m    Discharge Medications:   Allergies as of 06/08/2018   No Known Allergies     Medication List    TAKE these medications   allopurinol 100 MG tablet Commonly known as:  ZYLOPRIM Take 100 mg by mouth every other day.   diazepam 5 MG tablet Commonly known as:  VALIUM Take 1 tablet (5 mg total) by mouth every 8 (eight) hours as needed for muscle spasms.   lidocaine 5 % Commonly known as:  LIDODERM Place 1 patch onto the skin daily. Remove & Discard patch within 12 hours or as directed by MD   lisinopril-hydrochlorothiazide 10-12.5 MG tablet Commonly known as:  PRINZIDE,ZESTORETIC Take 1 tablet by mouth daily.   oxyCODONE-acetaminophen 5-325 MG tablet Commonly known as:  PERCOCET/ROXICET Take 1-2 tablets by mouth every 4 (four) hours as needed for moderate pain or severe pain.   Vitamin D2 2000 units Tabs Take 2,000 Units by mouth daily.       Diagnostic Studies: Dg Cervical Spine 1 View  Result Date: 06/07/2018 CLINICAL DATA:  Intraoperative views for C3-C6 posterior cervical decompression with instrumentation. EXAM: DG CERVICAL SPINE - 1 VIEW; CERVICAL SPINE - 2-3 VIEW; DG C-ARM 61-120 MIN COMPARISON:  None. FINDINGS: Lateral cross-table intraoperative view of the cervical spine demonstrates surgical instruments overlying the C3 and C4 spinous processes. Subsequent views demonstrate posterior instrumentation at C3 through C6. C4 through T1 ACDF partially visualized. No adverse  features. IMPRESSION: Intraoperative localization for C3-C6 posterior decompression with instrumentation. Electronically Signed   By: Rise MuBenjamin  McClintock M.D.   On: 06/07/2018 19:51   Dg Cervical Spine 2-3 Views  Result Date: 06/07/2018 CLINICAL DATA:  Intraoperative views for C3-C6 posterior cervical decompression with instrumentation. EXAM: DG CERVICAL SPINE - 1 VIEW; CERVICAL SPINE - 2-3 VIEW; DG C-ARM 61-120 MIN COMPARISON:  None. FINDINGS: Lateral cross-table  intraoperative view of the cervical spine demonstrates surgical instruments overlying the C3 and C4 spinous processes. Subsequent views demonstrate posterior instrumentation at C3 through C6. C4 through T1 ACDF partially visualized. No adverse features. IMPRESSION: Intraoperative localization for C3-C6 posterior decompression with instrumentation. Electronically Signed   By: Rise MuBenjamin  McClintock M.D.   On: 06/07/2018 19:51   Ct Head Wo Contrast  Result Date: 06/04/2018 CLINICAL DATA:  42 y/o M; fall backward hitting between shoulder blades and back of head. Immediate transient numbness in all 4 extremities after fall. Persistent tingling in the bilateral fingers. History of C3-C6 spinal fusion. EXAM: CT HEAD WITHOUT CONTRAST CT CERVICAL SPINE WITHOUT CONTRAST CT THORACIC SPINE WITHOUT CONTRAST TECHNIQUE: Multidetector CT imaging of the head and cervical spine was performed following the standard protocol without intravenous contrast. Multiplanar CT image reconstructions of the cervical spine were also generated. Multidetector CT images of the thoracic were obtained using the standard protocol without intravenous contrast. COMPARISON:  None. FINDINGS: CT HEAD FINDINGS Brain: No evidence of acute infarction, hemorrhage, hydrocephalus, extra-axial collection or mass lesion/mass effect. Vascular: No hyperdense vessel or unexpected calcification. Skull: Normal. Negative for fracture or focal lesion. Sinuses/Orbits: Right mastoid tip opacification. Normal aeration of left mastoid air cells and paranasal sinuses. Orbits are unremarkable. Other: None. CT CERVICAL SPINE FINDINGS Alignment: Normal. Skull base and vertebrae: No acute fracture. C4-T1 anterior cervical discectomy and fusion. Hardware is intact and there is no periprosthetic lucency or fracture. Soft tissues and spinal canal: No prevertebral fluid or swelling. No visible canal hematoma. Disc levels: C2-3: Left central disc protrusion with anterior cord  impingement. C3-4: Left subarticular disc protrusion with left anterior cord impingement and probable contact on exiting left C4 nerve roots. C4-5: Posterior ossific ridging with a large central osteophyte impinging and possibly compressing the cord with severe canal stenosis. C5-6: Posterior ossific ridging with moderate canal stenosis. C6-7: Posterior ossific ridging with mild canal stenosis. C7-T1: No significant disc displacement, foraminal stenosis, or canal stenosis. Upper chest: Negative. Other: Postsurgical changes from the left anterior neck. CT THORACIC SPINE FINDINGS Alignment: Normal. Skull base and vertebrae: No acute fracture. No primary bone lesion or focal pathologic process. Soft tissues and spinal canal: No prevertebral fluid or swelling. No visible canal hematoma. Disc levels: Multilevel discogenic degenerative changes with small disc protrusions at the right subarticular T4-5, right subarticular T6-7, right subarticular T8-9 levels. There is calcified ligamentum flavum hypertrophy at T2-3. No high-grade bony canal stenosis. Upper chest: Negative. Other: Negative. IMPRESSION: CT head: No acute intracranial abnormality or calvarial fracture. Unremarkable CT of the head. CT cervical spine: 1. No acute fracture or malalignment. C4-T1 ACDF without apparent hardware related complication. 2. Multilevel degenerative changes greatest at the C4-5 level where there is severe canal stenosis. Given patient symptoms in the setting of trauma, there may be cord contusion at this level. The cervical cord further assessed with cervical MRI as clinically indicated. CT thoracic spine: 1. No acute fracture or dislocation. 2. Mild discogenic degenerative changes of thoracic spine. These results were called by telephone at the time of interpretation on 06/04/2018 at 3:12  pm to Dr. Vanetta Mulders , who verbally acknowledged these results. Electronically Signed   By: Mitzi Hansen M.D.   On: 06/04/2018 15:18     Ct Cervical Spine Wo Contrast  Result Date: 06/04/2018 CLINICAL DATA:  42 y/o M; fall backward hitting between shoulder blades and back of head. Immediate transient numbness in all 4 extremities after fall. Persistent tingling in the bilateral fingers. History of C3-C6 spinal fusion. EXAM: CT HEAD WITHOUT CONTRAST CT CERVICAL SPINE WITHOUT CONTRAST CT THORACIC SPINE WITHOUT CONTRAST TECHNIQUE: Multidetector CT imaging of the head and cervical spine was performed following the standard protocol without intravenous contrast. Multiplanar CT image reconstructions of the cervical spine were also generated. Multidetector CT images of the thoracic were obtained using the standard protocol without intravenous contrast. COMPARISON:  None. FINDINGS: CT HEAD FINDINGS Brain: No evidence of acute infarction, hemorrhage, hydrocephalus, extra-axial collection or mass lesion/mass effect. Vascular: No hyperdense vessel or unexpected calcification. Skull: Normal. Negative for fracture or focal lesion. Sinuses/Orbits: Right mastoid tip opacification. Normal aeration of left mastoid air cells and paranasal sinuses. Orbits are unremarkable. Other: None. CT CERVICAL SPINE FINDINGS Alignment: Normal. Skull base and vertebrae: No acute fracture. C4-T1 anterior cervical discectomy and fusion. Hardware is intact and there is no periprosthetic lucency or fracture. Soft tissues and spinal canal: No prevertebral fluid or swelling. No visible canal hematoma. Disc levels: C2-3: Left central disc protrusion with anterior cord impingement. C3-4: Left subarticular disc protrusion with left anterior cord impingement and probable contact on exiting left C4 nerve roots. C4-5: Posterior ossific ridging with a large central osteophyte impinging and possibly compressing the cord with severe canal stenosis. C5-6: Posterior ossific ridging with moderate canal stenosis. C6-7: Posterior ossific ridging with mild canal stenosis. C7-T1: No significant  disc displacement, foraminal stenosis, or canal stenosis. Upper chest: Negative. Other: Postsurgical changes from the left anterior neck. CT THORACIC SPINE FINDINGS Alignment: Normal. Skull base and vertebrae: No acute fracture. No primary bone lesion or focal pathologic process. Soft tissues and spinal canal: No prevertebral fluid or swelling. No visible canal hematoma. Disc levels: Multilevel discogenic degenerative changes with small disc protrusions at the right subarticular T4-5, right subarticular T6-7, right subarticular T8-9 levels. There is calcified ligamentum flavum hypertrophy at T2-3. No high-grade bony canal stenosis. Upper chest: Negative. Other: Negative. IMPRESSION: CT head: No acute intracranial abnormality or calvarial fracture. Unremarkable CT of the head. CT cervical spine: 1. No acute fracture or malalignment. C4-T1 ACDF without apparent hardware related complication. 2. Multilevel degenerative changes greatest at the C4-5 level where there is severe canal stenosis. Given patient symptoms in the setting of trauma, there may be cord contusion at this level. The cervical cord further assessed with cervical MRI as clinically indicated. CT thoracic spine: 1. No acute fracture or dislocation. 2. Mild discogenic degenerative changes of thoracic spine. These results were called by telephone at the time of interpretation on 06/04/2018 at 3:12 pm to Dr. Vanetta Mulders , who verbally acknowledged these results. Electronically Signed   By: Mitzi Hansen M.D.   On: 06/04/2018 15:18   Ct Thoracic Spine Wo Contrast  Result Date: 06/04/2018 CLINICAL DATA:  42 y/o M; fall backward hitting between shoulder blades and back of head. Immediate transient numbness in all 4 extremities after fall. Persistent tingling in the bilateral fingers. History of C3-C6 spinal fusion. EXAM: CT HEAD WITHOUT CONTRAST CT CERVICAL SPINE WITHOUT CONTRAST CT THORACIC SPINE WITHOUT CONTRAST TECHNIQUE: Multidetector CT  imaging of the head and cervical spine was  performed following the standard protocol without intravenous contrast. Multiplanar CT image reconstructions of the cervical spine were also generated. Multidetector CT images of the thoracic were obtained using the standard protocol without intravenous contrast. COMPARISON:  None. FINDINGS: CT HEAD FINDINGS Brain: No evidence of acute infarction, hemorrhage, hydrocephalus, extra-axial collection or mass lesion/mass effect. Vascular: No hyperdense vessel or unexpected calcification. Skull: Normal. Negative for fracture or focal lesion. Sinuses/Orbits: Right mastoid tip opacification. Normal aeration of left mastoid air cells and paranasal sinuses. Orbits are unremarkable. Other: None. CT CERVICAL SPINE FINDINGS Alignment: Normal. Skull base and vertebrae: No acute fracture. C4-T1 anterior cervical discectomy and fusion. Hardware is intact and there is no periprosthetic lucency or fracture. Soft tissues and spinal canal: No prevertebral fluid or swelling. No visible canal hematoma. Disc levels: C2-3: Left central disc protrusion with anterior cord impingement. C3-4: Left subarticular disc protrusion with left anterior cord impingement and probable contact on exiting left C4 nerve roots. C4-5: Posterior ossific ridging with a large central osteophyte impinging and possibly compressing the cord with severe canal stenosis. C5-6: Posterior ossific ridging with moderate canal stenosis. C6-7: Posterior ossific ridging with mild canal stenosis. C7-T1: No significant disc displacement, foraminal stenosis, or canal stenosis. Upper chest: Negative. Other: Postsurgical changes from the left anterior neck. CT THORACIC SPINE FINDINGS Alignment: Normal. Skull base and vertebrae: No acute fracture. No primary bone lesion or focal pathologic process. Soft tissues and spinal canal: No prevertebral fluid or swelling. No visible canal hematoma. Disc levels: Multilevel discogenic degenerative  changes with small disc protrusions at the right subarticular T4-5, right subarticular T6-7, right subarticular T8-9 levels. There is calcified ligamentum flavum hypertrophy at T2-3. No high-grade bony canal stenosis. Upper chest: Negative. Other: Negative. IMPRESSION: CT head: No acute intracranial abnormality or calvarial fracture. Unremarkable CT of the head. CT cervical spine: 1. No acute fracture or malalignment. C4-T1 ACDF without apparent hardware related complication. 2. Multilevel degenerative changes greatest at the C4-5 level where there is severe canal stenosis. Given patient symptoms in the setting of trauma, there may be cord contusion at this level. The cervical cord further assessed with cervical MRI as clinically indicated. CT thoracic spine: 1. No acute fracture or dislocation. 2. Mild discogenic degenerative changes of thoracic spine. These results were called by telephone at the time of interpretation on 06/04/2018 at 3:12 pm to Dr. Vanetta Mulders , who verbally acknowledged these results. Electronically Signed   By: Mitzi Hansen M.D.   On: 06/04/2018 15:18   Mr Cervical Spine Wo Contrast  Result Date: 06/04/2018 CLINICAL DATA:  42 y/o M; fall with transient paralysis in all extremities and persistent tingling in the fingers and upper extremities. EXAM: MRI CERVICAL SPINE WITHOUT CONTRAST TECHNIQUE: Multiplanar, multisequence MR imaging of the cervical spine was performed. No intravenous contrast was administered. COMPARISON:  06/04/2018 CT of the cervical spine. FINDINGS: Alignment: Physiologic. Vertebrae: C4-T1 anterior cervical discectomy and fusion. Susceptibility artifact from fusion hardware partially obscures the vertebral bodies at those levels. No evidence for spinal fracture or ligamentous injury. No soft tissue edema. Cord: Mild edema within the central cord at the C3-4 level without cord volume loss. Increased signal within the cervical cord at the C4-5 and C5-6 levels  with loss of cord volume. Posterior Fossa, vertebral arteries, paraspinal tissues: Negative. Disc levels: Congenital cervical spinal canal stenosis. C2-3: Disc osteophyte complex with left-greater-than-right uncovertebral and facet hypertrophy. Mild left foraminal stenosis and mild canal stenosis. Small left central disc protrusion with ventral thecal sac effacement and  left anterior cord contact. C3-4: Disc osteophyte complex and bilateral uncovertebral/facet hypertrophy with moderate right and severe left foraminal stenosis and moderate to severe canal stenosis. C4-5: Discectomy, prominent posterior central endplate osteophytes, and bilateral uncovertebral/facet hypertrophy. Moderate bilateral foraminal stenosis. Severe canal stenosis. C5-6: Discectomy and fusion. Mild canal stenosis. No significant foraminal stenosis. C6-7: Discectomy and fusion. Posterior ossific ridging. Bilateral uncovertebral and facet hypertrophy. Mild foraminal and canal stenosis. C7-T1: Discectomy and fusion. No significant foraminal or canal stenosis. T1-2: Left-sided uncovertebral and facet hypertrophy with mild left foraminal stenosis. IMPRESSION: 1. Mild C3-4 cord edema without cord volume loss. Moderate to severe canal stenosis at this level. Cord edema probably represents a cord contusion. No associate cord hemorrhage. 2. 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 3. No acute fracture or ligamentous injury. 4. Congenital cervical canal stenosis, C4-T1 ACDF, and multilevel cervical spondylosis. 5. Moderate to severe C3-4 and severe C4-5 canal stenosis. Multilevel mild canal stenosis. 6. Severe left C3-4 foraminal stenosis. Multilevel mild and moderate foraminal stenosis. These results were called by telephone at the time of interpretation on 06/04/2018 at 6:35 pm to Dr. Vanetta Mulders , who verbally acknowledged these results. Electronically Signed    By: Mitzi Hansen M.D.   On: 06/04/2018 18:39   Dg C-arm 1-60 Min  Result Date: 06/07/2018 CLINICAL DATA:  Intraoperative views for C3-C6 posterior cervical decompression with instrumentation. EXAM: DG CERVICAL SPINE - 1 VIEW; CERVICAL SPINE - 2-3 VIEW; DG C-ARM 61-120 MIN COMPARISON:  None. FINDINGS: Lateral cross-table intraoperative view of the cervical spine demonstrates surgical instruments overlying the C3 and C4 spinous processes. Subsequent views demonstrate posterior instrumentation at C3 through C6. C4 through T1 ACDF partially visualized. No adverse features. IMPRESSION: Intraoperative localization for C3-C6 posterior decompression with instrumentation. Electronically Signed   By: Rise Mu M.D.   On: 06/07/2018 19:51    Disposition:    POD #1 s/p C3-C6 posterior cervical fusion and decompression, doing very well  - encourage ambulation - Percocet for pain, Valium for muscle spasms -Written scripts for pain signed and in chart -D/C instructions sheet printed and in chart -D/C today  -F/U in office 2 weeks   Signed: Georga Bora 06/28/2018, 1:57 PM

## 2018-10-07 ENCOUNTER — Other Ambulatory Visit: Payer: Self-pay | Admitting: Gastroenterology

## 2018-10-07 DIAGNOSIS — K639 Disease of intestine, unspecified: Secondary | ICD-10-CM

## 2018-10-17 ENCOUNTER — Ambulatory Visit
Admission: RE | Admit: 2018-10-17 | Discharge: 2018-10-17 | Disposition: A | Discharge status: Home / Self Care | Payer: Managed Care, Other (non HMO) | Source: Ambulatory Visit | Attending: Gastroenterology | Admitting: Gastroenterology

## 2018-10-17 DIAGNOSIS — K639 Disease of intestine, unspecified: Secondary | ICD-10-CM

## 2018-10-17 MED ORDER — IOPAMIDOL (ISOVUE-300) INJECTION 61%
125.0000 mL | Freq: Once | INTRAVENOUS | Status: AC | PRN
  Administered 2018-10-17: 125 mL via INTRAVENOUS

## 2019-08-23 IMAGING — CT CT CERVICAL SPINE W/O CM
2 of 12 series · 6 of 33 positions shown, 7 images · non-contrast
Comparison: None.

CLINICAL DATA: 41 y/o M; fall backward hitting between shoulder
blades and back of head. Immediate transient numbness in all 4
extremities after fall. Persistent tingling in the bilateral
fingers. History of C3-C6 spinal fusion.

EXAM:
CT HEAD WITHOUT CONTRAST
CT CERVICAL SPINE WITHOUT CONTRAST
CT THORACIC SPINE WITHOUT CONTRAST
TECHNIQUE: Multidetector CT imaging of the head and cervical spine was
performed following the standard protocol without intravenous
contrast. Multiplanar CT image reconstructions of the cervical spine
were also generated. Multidetector CT images of the thoracic were
obtained using the standard protocol without intravenous contrast.

[Series 11: c_spine 2.0 sag bone · sagittal · 0.44mm/px · 3 of 90 slices shown]
[im 23/90  bone]
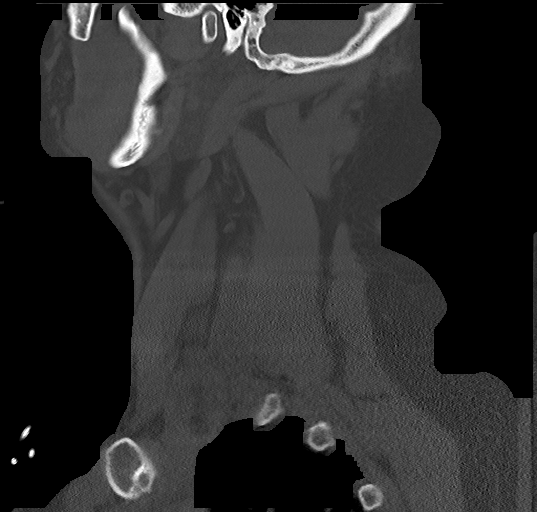
[im 45/90  bone]
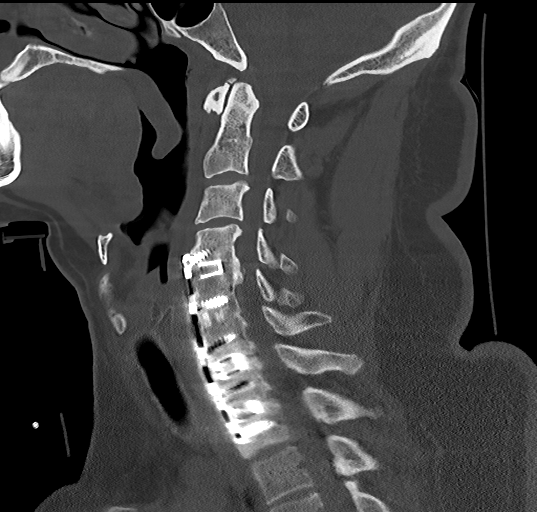
[im 67/90  bone]
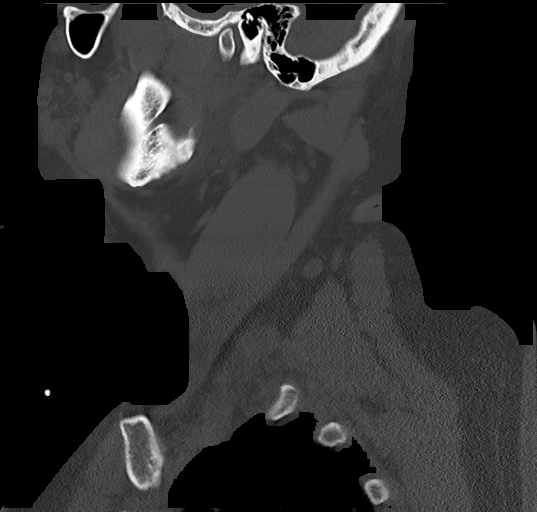

[Series 16: t-spine 2.0 st · axial · 0.46mm/px · z∈[-599,-271]mm · 3 of 165 slices shown, 4 images]
[im 1/165  soft-tissue]
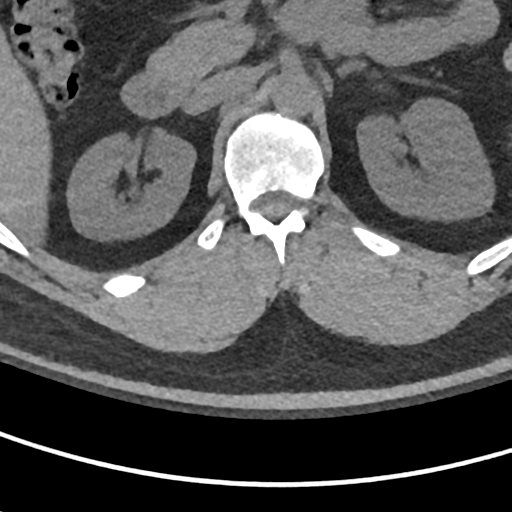
[im 1/165  bone]
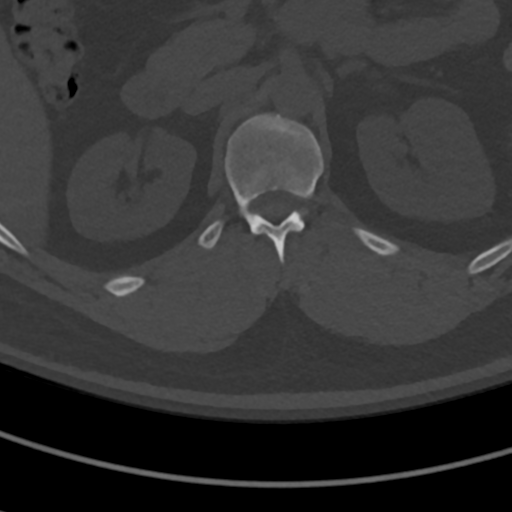
[im 83/165  bone]
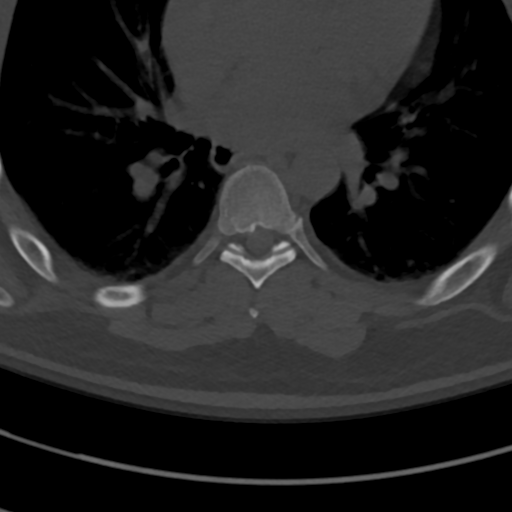
[im 165/165  bone]
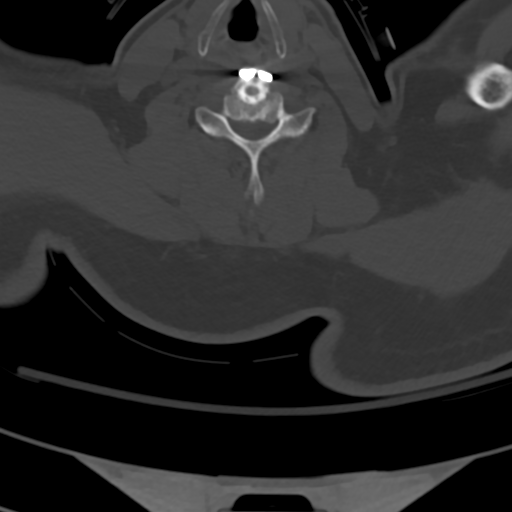

[6 of 33 positions shown; findings below may reference images not displayed]

FINDINGS: CT HEAD FINDINGS

Brain: No evidence of acute infarction, hemorrhage, hydrocephalus,
extra-axial collection or mass lesion/mass effect.

Vascular: No hyperdense vessel or unexpected calcification.

Skull: Normal. Negative for fracture or focal lesion.

Sinuses/Orbits: Right mastoid tip opacification. Normal aeration of
left mastoid air cells and paranasal sinuses. Orbits are
unremarkable.

Other: None.

CT CERVICAL SPINE FINDINGS

Alignment: Normal.

Skull base and vertebrae: No acute fracture. C4-T1 anterior cervical
discectomy and fusion. Hardware is intact and there is no
periprosthetic lucency or fracture.

Soft tissues and spinal canal: No prevertebral fluid or swelling. No
visible canal hematoma.

Disc levels:

C2-3: Left central disc protrusion with anterior cord impingement.

C3-4: Left subarticular disc protrusion with left anterior cord
impingement and probable contact on exiting left C4 nerve roots.

C4-5: Posterior ossific ridging with a large central osteophyte
impinging and possibly compressing the cord with severe canal
stenosis.

C5-6: Posterior ossific ridging with moderate canal stenosis.

C6-7: Posterior ossific ridging with mild canal stenosis.

C7-T1: No significant disc displacement, foraminal stenosis, or
canal stenosis.

Upper chest: Negative.

Other: Postsurgical changes from the left anterior neck.

CT THORACIC SPINE FINDINGS

Alignment: Normal.

Skull base and vertebrae: No acute fracture. No primary bone lesion
or focal pathologic process.

Soft tissues and spinal canal: No prevertebral fluid or swelling. No
visible canal hematoma.

Disc levels: Multilevel discogenic degenerative changes with small
disc protrusions at the right subarticular T4-5, right subarticular
T6-7, right subarticular T8-9 levels. There is calcified ligamentum
flavum hypertrophy at T2-3. No high-grade bony canal stenosis.

Upper chest: Negative.

Other: Negative.
IMPRESSION: CT head:

No acute intracranial abnormality or calvarial fracture.
Unremarkable CT of the head.

CT cervical spine:

1. No acute fracture or malalignment. C4-T1 ACDF without apparent
hardware related complication.
2. Multilevel degenerative changes greatest at the C4-5 level where
there is severe canal stenosis. Given patient symptoms in the
setting of trauma, there may be cord contusion at this level. The
cervical cord further assessed with cervical MRI as clinically
indicated.

CT thoracic spine:

1. No acute fracture or dislocation.
2. Mild discogenic degenerative changes of thoracic spine.

These results were called by telephone at the time of interpretation
on 06/04/2018 at [DATE] to Dr. ANUKAA DOSTER , who verbally
acknowledged these results.

By: Gulshirin Sovetova M.D.
